# Patient Record
Sex: Female | Born: 1935
Health system: Southern US, Community
[De-identification: ages and names within clinical notes are randomized; demographics above are authoritative.]

## PROBLEM LIST (undated history)

## (undated) DIAGNOSIS — I4819 Other persistent atrial fibrillation: Secondary | ICD-10-CM

## (undated) DIAGNOSIS — G8929 Other chronic pain: Secondary | ICD-10-CM

## (undated) DIAGNOSIS — M25562 Pain in left knee: Secondary | ICD-10-CM

## (undated) DIAGNOSIS — N189 Chronic kidney disease, unspecified: Secondary | ICD-10-CM

## (undated) DIAGNOSIS — I10 Essential (primary) hypertension: Secondary | ICD-10-CM

## (undated) DIAGNOSIS — M79662 Pain in left lower leg: Secondary | ICD-10-CM

## (undated) DIAGNOSIS — M25561 Pain in right knee: Secondary | ICD-10-CM

## (undated) DIAGNOSIS — S8991XA Unspecified injury of right lower leg, initial encounter: Secondary | ICD-10-CM

## (undated) DIAGNOSIS — M1990 Unspecified osteoarthritis, unspecified site: Secondary | ICD-10-CM

## (undated) DIAGNOSIS — K219 Gastro-esophageal reflux disease without esophagitis: Secondary | ICD-10-CM

## (undated) DIAGNOSIS — R001 Bradycardia, unspecified: Secondary | ICD-10-CM

## (undated) DIAGNOSIS — G629 Polyneuropathy, unspecified: Secondary | ICD-10-CM

## (undated) DIAGNOSIS — R351 Nocturia: Secondary | ICD-10-CM

## (undated) DIAGNOSIS — K259 Gastric ulcer, unspecified as acute or chronic, without hemorrhage or perforation: Secondary | ICD-10-CM

## (undated) DIAGNOSIS — I509 Heart failure, unspecified: Secondary | ICD-10-CM

## (undated) DIAGNOSIS — B351 Tinea unguium: Secondary | ICD-10-CM

## (undated) DIAGNOSIS — M79661 Pain in right lower leg: Secondary | ICD-10-CM

## (undated) DIAGNOSIS — Z7901 Long term (current) use of anticoagulants: Secondary | ICD-10-CM

## (undated) HISTORY — DX: Pain in right knee: M25.561

## (undated) HISTORY — PX: CATARACT EXTRACTION: SUR2

## (undated) HISTORY — DX: Bradycardia, unspecified: R00.1

## (undated) HISTORY — DX: Other persistent atrial fibrillation: I48.19

## (undated) HISTORY — DX: Polyneuropathy, unspecified: G62.9

## (undated) HISTORY — DX: Pain in left knee: M25.562

## (undated) HISTORY — DX: Heart failure, unspecified: I50.9

## (undated) HISTORY — PX: OVARIAN CYST REMOVAL: SHX89

## (undated) HISTORY — DX: Other chronic pain: G89.29

## (undated) HISTORY — DX: Pain in right lower leg: M79.661

## (undated) HISTORY — DX: Tinea unguium: B35.1

## (undated) HISTORY — DX: Unspecified injury of right lower leg, initial encounter: S89.91XA

## (undated) HISTORY — DX: Pain in left lower leg: M79.662

## (undated) HISTORY — DX: Long term (current) use of anticoagulants: Z79.01

---

## 2004-03-26 ENCOUNTER — Emergency Department (HOSPITAL_COMMUNITY): Admission: EM | Admit: 2004-03-26 | Discharge: 2004-03-27 | Payer: Self-pay | Admitting: Emergency Medicine

## 2004-03-27 ENCOUNTER — Ambulatory Visit (HOSPITAL_COMMUNITY): Admission: RE | Admit: 2004-03-27 | Discharge: 2004-03-27 | Payer: Self-pay | Admitting: Emergency Medicine

## 2004-05-01 ENCOUNTER — Ambulatory Visit (HOSPITAL_COMMUNITY): Admission: RE | Admit: 2004-05-01 | Discharge: 2004-05-01 | Payer: Self-pay | Admitting: Orthopedic Surgery

## 2007-01-06 ENCOUNTER — Ambulatory Visit (HOSPITAL_BASED_OUTPATIENT_CLINIC_OR_DEPARTMENT_OTHER): Admission: RE | Admit: 2007-01-06 | Discharge: 2007-01-06 | Payer: Self-pay | Admitting: Orthopedic Surgery

## 2011-11-21 ENCOUNTER — Encounter (HOSPITAL_COMMUNITY): Payer: Self-pay | Admitting: Pharmacy Technician

## 2011-11-24 ENCOUNTER — Other Ambulatory Visit: Payer: Self-pay | Admitting: Orthopedic Surgery

## 2011-12-01 ENCOUNTER — Encounter (HOSPITAL_COMMUNITY)
Admission: RE | Admit: 2011-12-01 | Discharge: 2011-12-01 | Disposition: A | Discharge status: Home / Self Care | Payer: Medicare Other | Source: Ambulatory Visit | Attending: Orthopedic Surgery | Admitting: Orthopedic Surgery

## 2011-12-01 ENCOUNTER — Encounter (HOSPITAL_COMMUNITY): Payer: Self-pay

## 2011-12-01 HISTORY — DX: Nocturia: R35.1

## 2011-12-01 HISTORY — DX: Gastric ulcer, unspecified as acute or chronic, without hemorrhage or perforation: K25.9

## 2011-12-01 HISTORY — DX: Unspecified osteoarthritis, unspecified site: M19.90

## 2011-12-01 HISTORY — DX: Essential (primary) hypertension: I10

## 2011-12-01 HISTORY — DX: Gastro-esophageal reflux disease without esophagitis: K21.9

## 2011-12-01 LAB — COMPREHENSIVE METABOLIC PANEL
ALT: 13 U/L (ref 0–35)
AST: 17 U/L (ref 0–37)
Albumin: 3.9 g/dL (ref 3.5–5.2)
Alkaline Phosphatase: 122 U/L — ABNORMAL HIGH (ref 39–117)
Calcium: 9.9 mg/dL (ref 8.4–10.5)
GFR calc Af Amer: 43 mL/min — ABNORMAL LOW (ref >90)
Glucose, Bld: 96 mg/dL (ref 70–99)
Potassium: 4.6 mEq/L (ref 3.5–5.1)
Sodium: 143 mEq/L (ref 135–145)
Total Protein: 7.6 g/dL (ref 6.0–8.3)

## 2011-12-01 LAB — CBC
MCH: 30.1 pg (ref 26.0–34.0)
MCHC: 33.6 g/dL (ref 30.0–36.0)
Platelets: 221 10*3/uL (ref 150–400)
RDW: 12.2 % (ref 11.5–15.5)

## 2011-12-01 LAB — DIFFERENTIAL
Eosinophils Relative: 2 % (ref 0–5)
Lymphocytes Relative: 34 % (ref 12–46)
Lymphs Abs: 2.3 10*3/uL (ref 0.7–4.0)
Monocytes Relative: 6 % (ref 3–12)

## 2011-12-01 LAB — URINE MICROSCOPIC-ADD ON

## 2011-12-01 LAB — URINALYSIS, ROUTINE W REFLEX MICROSCOPIC
Hgb urine dipstick: NEGATIVE
Nitrite: POSITIVE — AB
Specific Gravity, Urine: 1.017 (ref 1.005–1.030)
Urobilinogen, UA: 0.2 mg/dL (ref 0.0–1.0)
pH: 5 (ref 5.0–8.0)

## 2011-12-01 LAB — APTT: aPTT: 37 seconds (ref 24–37)

## 2011-12-01 LAB — PROTIME-INR: Prothrombin Time: 13.1 seconds (ref 11.6–15.2)

## 2011-12-01 LAB — SURGICAL PCR SCREEN: Staphylococcus aureus: NEGATIVE

## 2011-12-01 NOTE — Pre-Procedure Instructions (Signed)
[place]  12/01/2011   Your procedure is scheduled on: 12-08-2011 @7 :30 AM  Report to [place] at 5:30AM.  Call this number if you have problems the morning of surgery: [phone]   Remember:   Do not eat food:After Midnight.  May have clear liquids: up to 4 Hours before arrival.  Clear liquids include soda, tea, black coffee, apple or grape juice, broth. Until 1:30 AM  Take these medicines the morning of surgery with A SIP OF WATER: Prilosec   Do not wear jewelry, make-up or nail polish.  Do not wear lotions, powders, or perfumes. You may wear deodorant.  Do not shave 48 hours prior to surgery.  Do not bring valuables to the hospital.  Contacts, dentures or bridgework may not be worn into surgery.  Leave suitcase in the car. After surgery it may be brought to your room.  For patients admitted to the hospital, checkout time is 11:00 AM the day of discharge.   Patients discharged the day of surgery will not be allowed to drive home.  Name and phone number of your driver  Special Instructions: Incentive Spirometry - Practice and bring it with you on the day of surgery., CHG Shower Use Special Wash: 1/2 bottle night before surgery and 1/2 bottle morning of surgery. and N/A   Please read over the following fact sheets that you were given: Pain Booklet, MRSA Information and Surgical Site Infection Prevention

## 2011-12-01 NOTE — Pre-Procedure Instructions (Signed)
[place]  12/01/2011   Your procedure is scheduled on:12-08-2011 @7 :30 AM Report to [place] at 5:30 AM.  Call this number if you have problems the morning of surgery: [phone]   Remember:   Do not eat food:After Midnight.  May have clear liquids: up to 4 Hours before arrival.  Clear liquids include soda, tea, black coffee, apple or grape juice, broth.  Until 1:30 AM   Take these medicines the morning of surgery with A SIP OF WATER:  Prilosec   Do not wear jewelry, make-up or nail polish.  Do not wear lotions, powders, or perfumes. You may wear deodorant.  Do not shave 48 hours prior to surgery.  Do not bring valuables to the hospital.  Contacts, dentures or bridgework may not be worn into surgery.  Leave suitcase in the car. After surgery it may be brought to your room.  For patients admitted to the hospital, checkout time is 11:00 AM the day of discharge.   Patients discharged the day of surgery will not be allowed to drive home.  Name and phone number of your driver:  Special Instructions: Incentive Spirometry - Practice and bring it with you on the day of surgery. and CHG Shower Use Special Wash: 1/2 bottle night before surgery and 1/2 bottle morning of surgery.   Please read over the following fact sheets that you were given: MRSA Information and Surgical Site Infection Prevention

## 2011-12-01 NOTE — Progress Notes (Signed)
No Stress test available.  Cardiac cath report received from [facility].

## 2011-12-01 NOTE — Progress Notes (Signed)
EKG and CXR requested from Dr. Charisse Klinefelter [phone] or [phone].  Stress test requested from [facility], will bring paper DOS that tell what she will take,will need to sign blood refusal form DOS. Dr. Ronnie Derby aware that pt. Refuses blood.

## 2011-12-02 NOTE — Progress Notes (Signed)
cxr not available dr Huey Bienenstock.need dos

## 2011-12-03 LAB — URINE CULTURE: Culture  Setup Time: 201212171135

## 2011-12-07 MED ORDER — VANCOMYCIN HCL IN DEXTROSE 1-5 GM/200ML-% IV SOLN
1000.0000 mg | INTRAVENOUS | Status: DC
  Filled 2011-12-07: qty 200

## 2011-12-07 MED ORDER — SODIUM CHLORIDE 0.9 % IV SOLN: INTRAVENOUS | Status: DC

## 2011-12-07 MED ORDER — CHLORHEXIDINE GLUCONATE 4 % EX LIQD: 60.0000 mL | Freq: Once | CUTANEOUS | Status: DC

## 2011-12-07 MED ORDER — ACETAMINOPHEN 10 MG/ML IV SOLN
1000.0000 mg | Freq: Four times a day (QID) | INTRAVENOUS | Status: DC
  Filled 2011-12-07 (×4): qty 100

## 2011-12-08 ENCOUNTER — Encounter (HOSPITAL_COMMUNITY): Payer: Self-pay | Admitting: Anesthesiology

## 2011-12-08 ENCOUNTER — Inpatient Hospital Stay (HOSPITAL_COMMUNITY)
Admission: RE | Admit: 2011-12-08 | Discharge: 2011-12-11 | DRG: 470 | Disposition: A | Discharge status: Skilled Nursing Facility | Payer: Medicare Other | Source: Ambulatory Visit | Attending: Orthopedic Surgery | Admitting: Orthopedic Surgery

## 2011-12-08 ENCOUNTER — Encounter (HOSPITAL_COMMUNITY)
Admission: RE | Disposition: A | Discharge status: Skilled Nursing Facility | Payer: Self-pay | Source: Ambulatory Visit | Attending: Orthopedic Surgery

## 2011-12-08 ENCOUNTER — Inpatient Hospital Stay (HOSPITAL_COMMUNITY): Payer: Medicare Other

## 2011-12-08 ENCOUNTER — Inpatient Hospital Stay (HOSPITAL_COMMUNITY): Payer: Medicare Other | Admitting: Anesthesiology

## 2011-12-08 DIAGNOSIS — Z886 Allergy status to analgesic agent status: Secondary | ICD-10-CM

## 2011-12-08 DIAGNOSIS — I10 Essential (primary) hypertension: Secondary | ICD-10-CM | POA: Diagnosis present

## 2011-12-08 DIAGNOSIS — M1710 Unilateral primary osteoarthritis, unspecified knee: Principal | ICD-10-CM | POA: Diagnosis present

## 2011-12-08 DIAGNOSIS — Z880 Allergy status to penicillin: Secondary | ICD-10-CM

## 2011-12-08 DIAGNOSIS — D62 Acute posthemorrhagic anemia: Secondary | ICD-10-CM | POA: Diagnosis not present

## 2011-12-08 DIAGNOSIS — M1712 Unilateral primary osteoarthritis, left knee: Secondary | ICD-10-CM

## 2011-12-08 DIAGNOSIS — Z888 Allergy status to other drugs, medicaments and biological substances status: Secondary | ICD-10-CM

## 2011-12-08 HISTORY — PX: TOTAL KNEE ARTHROPLASTY: SHX125

## 2011-12-08 SURGERY — ARTHROPLASTY, KNEE, TOTAL
Anesthesia: Regional | Site: Knee | Laterality: Left | Wound class: Clean

## 2011-12-08 MED ORDER — HYDROMORPHONE HCL PF 1 MG/ML IJ SOLN
0.2500 mg | INTRAMUSCULAR | Status: DC | PRN
  Administered 2011-12-08 (×2): 0.5 mg via INTRAVENOUS

## 2011-12-08 MED ORDER — PROPOFOL 10 MG/ML IV EMUL
INTRAVENOUS | Status: DC | PRN
  Administered 2011-12-08: 180 mg via INTRAVENOUS

## 2011-12-08 MED ORDER — BUPIVACAINE 0.25 % ON-Q PUMP SINGLE CATH 300ML
INJECTION | Status: DC | PRN
  Administered 2011-12-08: 300 mL

## 2011-12-08 MED ORDER — LIDOCAINE HCL (CARDIAC) 20 MG/ML IV SOLN
INTRAVENOUS | Status: DC | PRN
  Administered 2011-12-08: 80 mg via INTRAVENOUS

## 2011-12-08 MED ORDER — VANCOMYCIN HCL IN DEXTROSE 1-5 GM/200ML-% IV SOLN
1000.0000 mg | Freq: Two times a day (BID) | INTRAVENOUS | Status: AC
  Administered 2011-12-08: 1000 mg via INTRAVENOUS
  Filled 2011-12-08: qty 200

## 2011-12-08 MED ORDER — ONDANSETRON HCL 4 MG/2ML IJ SOLN: 4.0000 mg | Freq: Four times a day (QID) | INTRAMUSCULAR | Status: DC | PRN

## 2011-12-08 MED ORDER — METOCLOPRAMIDE HCL 5 MG/ML IJ SOLN
5.0000 mg | Freq: Three times a day (TID) | INTRAMUSCULAR | Status: DC | PRN
  Filled 2011-12-08: qty 2

## 2011-12-08 MED ORDER — OLMESARTAN MEDOXOMIL 40 MG PO TABS
40.0000 mg | ORAL_TABLET | Freq: Every day | ORAL | Status: DC
  Filled 2011-12-08: qty 1

## 2011-12-08 MED ORDER — DIPHENHYDRAMINE HCL 12.5 MG/5ML PO ELIX
12.5000 mg | ORAL_SOLUTION | ORAL | Status: DC | PRN
  Filled 2011-12-08: qty 10

## 2011-12-08 MED ORDER — BUPIVACAINE 0.25 % ON-Q PUMP SINGLE CATH 300ML
300.0000 mL | INJECTION | Status: DC
  Filled 2011-12-08: qty 300

## 2011-12-08 MED ORDER — AMLODIPINE BESYLATE 5 MG PO TABS
5.0000 mg | ORAL_TABLET | Freq: Every day | ORAL | Status: DC
  Filled 2011-12-08: qty 1

## 2011-12-08 MED ORDER — BISACODYL 5 MG PO TBEC: 5.0000 mg | DELAYED_RELEASE_TABLET | Freq: Every day | ORAL | Status: DC | PRN

## 2011-12-08 MED ORDER — HYDROMORPHONE HCL PF 1 MG/ML IJ SOLN
0.5000 mg | INTRAMUSCULAR | Status: DC | PRN
  Administered 2011-12-08: 1 mg via INTRAVENOUS
  Administered 2011-12-08 – 2011-12-09 (×3): 0.5 mg via INTRAVENOUS
  Filled 2011-12-08 (×3): qty 1

## 2011-12-08 MED ORDER — ONDANSETRON HCL 4 MG PO TABS: 4.0000 mg | ORAL_TABLET | Freq: Four times a day (QID) | ORAL | Status: DC | PRN

## 2011-12-08 MED ORDER — HYDROCHLOROTHIAZIDE 50 MG PO TABS
50.0000 mg | ORAL_TABLET | Freq: Every day | ORAL | Status: DC
  Administered 2011-12-08: 50 mg via ORAL
  Filled 2011-12-08 (×4): qty 1

## 2011-12-08 MED ORDER — ONDANSETRON HCL 4 MG/2ML IJ SOLN
INTRAMUSCULAR | Status: DC | PRN
  Administered 2011-12-08: 4 mg via INTRAVENOUS

## 2011-12-08 MED ORDER — METOCLOPRAMIDE HCL 10 MG PO TABS: 5.0000 mg | ORAL_TABLET | Freq: Three times a day (TID) | ORAL | Status: DC | PRN

## 2011-12-08 MED ORDER — METHOCARBAMOL 100 MG/ML IJ SOLN
500.0000 mg | INTRAVENOUS | Status: AC
  Administered 2011-12-08: 500 mg via INTRAVENOUS
  Filled 2011-12-08: qty 5

## 2011-12-08 MED ORDER — PANTOPRAZOLE SODIUM 40 MG PO TBEC
40.0000 mg | DELAYED_RELEASE_TABLET | Freq: Every day | ORAL | Status: DC
  Administered 2011-12-09 – 2011-12-11 (×3): 40 mg via ORAL
  Filled 2011-12-08 (×4): qty 1

## 2011-12-08 MED ORDER — OXYCODONE HCL 10 MG PO TB12
10.0000 mg | ORAL_TABLET | Freq: Two times a day (BID) | ORAL | Status: DC
  Administered 2011-12-08 – 2011-12-11 (×7): 10 mg via ORAL
  Filled 2011-12-08 (×7): qty 1

## 2011-12-08 MED ORDER — SODIUM CHLORIDE 0.9 % IR SOLN
Status: DC | PRN
  Administered 2011-12-08: 3000 mL

## 2011-12-08 MED ORDER — ACETAMINOPHEN 325 MG PO TABS
650.0000 mg | ORAL_TABLET | Freq: Four times a day (QID) | ORAL | Status: DC | PRN
  Administered 2011-12-09 – 2011-12-11 (×3): 650 mg via ORAL
  Filled 2011-12-08 (×3): qty 2

## 2011-12-08 MED ORDER — BUPIVACAINE ON-Q PAIN PUMP (FOR ORDER SET NO CHG)
INJECTION | Status: AC
  Filled 2011-12-08: qty 1

## 2011-12-08 MED ORDER — METHOCARBAMOL 500 MG PO TABS
500.0000 mg | ORAL_TABLET | Freq: Four times a day (QID) | ORAL | Status: DC | PRN
  Administered 2011-12-08 – 2011-12-10 (×3): 500 mg via ORAL
  Filled 2011-12-08 (×4): qty 1

## 2011-12-08 MED ORDER — AMLODIPINE-OLMESARTAN 10-40 MG PO TABS: 1.0000 | ORAL_TABLET | Freq: Every day | ORAL | Status: DC

## 2011-12-08 MED ORDER — OXYCODONE HCL 5 MG PO TABS
5.0000 mg | ORAL_TABLET | ORAL | Status: DC | PRN
  Administered 2011-12-08: 10 mg via ORAL
  Administered 2011-12-09 – 2011-12-10 (×5): 5 mg via ORAL
  Administered 2011-12-11 (×3): 10 mg via ORAL
  Filled 2011-12-08 (×4): qty 1
  Filled 2011-12-08 (×4): qty 2
  Filled 2011-12-08 (×2): qty 1

## 2011-12-08 MED ORDER — ENOXAPARIN SODIUM 40 MG/0.4ML ~~LOC~~ SOLN
40.0000 mg | SUBCUTANEOUS | Status: DC
  Administered 2011-12-09 – 2011-12-11 (×3): 40 mg via SUBCUTANEOUS
  Filled 2011-12-08 (×4): qty 0.4

## 2011-12-08 MED ORDER — ZOLPIDEM TARTRATE 5 MG PO TABS: 5.0000 mg | ORAL_TABLET | Freq: Every evening | ORAL | Status: DC | PRN

## 2011-12-08 MED ORDER — METHOCARBAMOL 100 MG/ML IJ SOLN
500.0000 mg | Freq: Four times a day (QID) | INTRAVENOUS | Status: DC | PRN
  Filled 2011-12-08: qty 5

## 2011-12-08 MED ORDER — LACTATED RINGERS IV SOLN
INTRAVENOUS | Status: DC | PRN
  Administered 2011-12-08 (×2): via INTRAVENOUS

## 2011-12-08 MED ORDER — ONDANSETRON HCL 4 MG/2ML IJ SOLN: 4.0000 mg | Freq: Once | INTRAMUSCULAR | Status: DC | PRN

## 2011-12-08 MED ORDER — BUPIVACAINE-EPINEPHRINE 0.25% -1:200000 IJ SOLN
INTRAMUSCULAR | Status: DC | PRN
  Administered 2011-12-08: 20 mL

## 2011-12-08 MED ORDER — AMLODIPINE BESYLATE 10 MG PO TABS
10.0000 mg | ORAL_TABLET | Freq: Every day | ORAL | Status: DC
  Administered 2011-12-08: 10 mg via ORAL
  Filled 2011-12-08 (×4): qty 1

## 2011-12-08 MED ORDER — ACETAMINOPHEN 650 MG RE SUPP: 650.0000 mg | Freq: Four times a day (QID) | RECTAL | Status: DC | PRN

## 2011-12-08 MED ORDER — SODIUM CHLORIDE 0.9 % IV SOLN: INTRAVENOUS | Status: DC

## 2011-12-08 MED ORDER — ALUM & MAG HYDROXIDE-SIMETH 200-200-20 MG/5ML PO SUSP: 30.0000 mL | ORAL | Status: DC | PRN

## 2011-12-08 MED ORDER — EPHEDRINE SULFATE 50 MG/ML IJ SOLN
INTRAMUSCULAR | Status: DC | PRN
  Administered 2011-12-08 (×2): 10 mg via INTRAVENOUS

## 2011-12-08 MED ORDER — LISINOPRIL 20 MG PO TABS
20.0000 mg | ORAL_TABLET | Freq: Every day | ORAL | Status: DC
  Administered 2011-12-08: 20 mg via ORAL
  Filled 2011-12-08 (×4): qty 1

## 2011-12-08 MED ORDER — BUPIVACAINE-EPINEPHRINE PF 0.5-1:200000 % IJ SOLN
INTRAMUSCULAR | Status: DC | PRN
  Administered 2011-12-08: 20 mL

## 2011-12-08 MED ORDER — SENNOSIDES-DOCUSATE SODIUM 8.6-50 MG PO TABS: 1.0000 | ORAL_TABLET | Freq: Every evening | ORAL | Status: DC | PRN

## 2011-12-08 MED ORDER — FENTANYL CITRATE 0.05 MG/ML IJ SOLN
INTRAMUSCULAR | Status: DC | PRN
  Administered 2011-12-08 (×5): 50 ug via INTRAVENOUS

## 2011-12-08 MED ORDER — PHENOL 1.4 % MT LIQD
1.0000 | OROMUCOSAL | Status: DC | PRN
  Filled 2011-12-08: qty 177

## 2011-12-08 MED ORDER — CHLORTHALIDONE 25 MG PO TABS
25.0000 mg | ORAL_TABLET | Freq: Every day | ORAL | Status: DC
  Administered 2011-12-08: 25 mg via ORAL
  Filled 2011-12-08 (×4): qty 1

## 2011-12-08 MED ORDER — MENTHOL 3 MG MT LOZG: 1.0000 | LOZENGE | OROMUCOSAL | Status: DC | PRN

## 2011-12-08 MED ORDER — VANCOMYCIN HCL 1000 MG IV SOLR
1000.0000 mg | INTRAVENOUS | Status: DC | PRN
  Administered 2011-12-08: 1000 mg via INTRAVENOUS

## 2011-12-08 MED ORDER — DOCUSATE SODIUM 100 MG PO CAPS
100.0000 mg | ORAL_CAPSULE | Freq: Two times a day (BID) | ORAL | Status: DC
  Administered 2011-12-08 – 2011-12-11 (×7): 100 mg via ORAL
  Filled 2011-12-08 (×8): qty 1

## 2011-12-08 MED ORDER — OLMESARTAN MEDOXOMIL 40 MG PO TABS
40.0000 mg | ORAL_TABLET | Freq: Every day | ORAL | Status: DC
  Administered 2011-12-08: 40 mg via ORAL
  Filled 2011-12-08 (×4): qty 1

## 2011-12-08 SURGICAL SUPPLY — 59 items
BANDAGE ESMARK 6X9 LF (GAUZE/BANDAGES/DRESSINGS) ×1 IMPLANT
BLADE SAGITTAL 13X1.27X60 (BLADE) ×2 IMPLANT
BLADE SAW SGTL 83.5X18.5 (BLADE) ×2 IMPLANT
BNDG ESMARK 6X9 LF (GAUZE/BANDAGES/DRESSINGS) ×2
BOWL SMART MIX CTS (DISPOSABLE) ×2 IMPLANT
CATH KIT ON Q 10IN SLV (PAIN MANAGEMENT) ×2 IMPLANT
CEMENT BONE SIMPLEX SPEEDSET (Cement) ×4 IMPLANT
CLOTH BEACON ORANGE TIMEOUT ST (SAFETY) ×2 IMPLANT
COVER BACK TABLE 24X17X13 BIG (DRAPES) IMPLANT
COVER SURGICAL LIGHT HANDLE (MISCELLANEOUS) ×2 IMPLANT
CUFF TOURNIQUET SINGLE 34IN LL (TOURNIQUET CUFF) ×2 IMPLANT
DRAPE EXTREMITY T 121X128X90 (DRAPE) ×2 IMPLANT
DRAPE INCISE IOBAN 66X45 STRL (DRAPES) ×4 IMPLANT
DRAPE PROXIMA HALF (DRAPES) ×2 IMPLANT
DRAPE U-SHAPE 47X51 STRL (DRAPES) ×2 IMPLANT
DRSG ADAPTIC 3X8 NADH LF (GAUZE/BANDAGES/DRESSINGS) ×2 IMPLANT
DRSG PAD ABDOMINAL 8X10 ST (GAUZE/BANDAGES/DRESSINGS) ×2 IMPLANT
DURAPREP 26ML APPLICATOR (WOUND CARE) ×4 IMPLANT
ELECT REM PT RETURN 9FT ADLT (ELECTROSURGICAL) ×2
ELECTRODE REM PT RTRN 9FT ADLT (ELECTROSURGICAL) ×1 IMPLANT
EVACUATOR 1/8 PVC DRAIN (DRAIN) ×2 IMPLANT
GAUZE SPONGE 4X4 12PLY STRL LF (GAUZE/BANDAGES/DRESSINGS) ×2 IMPLANT
GLOVE BIOGEL M 7.0 STRL (GLOVE) IMPLANT
GLOVE BIOGEL PI IND STRL 7.0 (GLOVE) ×2 IMPLANT
GLOVE BIOGEL PI IND STRL 7.5 (GLOVE) ×2 IMPLANT
GLOVE BIOGEL PI IND STRL 8.5 (GLOVE) ×2 IMPLANT
GLOVE BIOGEL PI INDICATOR 7.0 (GLOVE) ×2
GLOVE BIOGEL PI INDICATOR 7.5 (GLOVE) ×2
GLOVE BIOGEL PI INDICATOR 8.5 (GLOVE) ×2
GLOVE SURG ORTHO 8.0 STRL STRW (GLOVE) ×4 IMPLANT
GLOVE SURG SS PI 7.5 STRL IVOR (GLOVE) ×2 IMPLANT
GOWN PREVENTION PLUS XLARGE (GOWN DISPOSABLE) ×4 IMPLANT
GOWN STRL NON-REIN LRG LVL3 (GOWN DISPOSABLE) ×4 IMPLANT
HANDPIECE INTERPULSE COAX TIP (DISPOSABLE) ×1
HOOD PEEL AWAY FACE SHEILD DIS (HOOD) ×8 IMPLANT
KIT BASIN OR (CUSTOM PROCEDURE TRAY) ×2 IMPLANT
KIT ROOM TURNOVER OR (KITS) ×2 IMPLANT
MANIFOLD NEPTUNE II (INSTRUMENTS) ×2 IMPLANT
NEEDLE 22X1 1/2 (OR ONLY) (NEEDLE) IMPLANT
NS IRRIG 1000ML POUR BTL (IV SOLUTION) ×2 IMPLANT
PACK TOTAL JOINT (CUSTOM PROCEDURE TRAY) ×2 IMPLANT
PAD ARMBOARD 7.5X6 YLW CONV (MISCELLANEOUS) ×4 IMPLANT
PADDING CAST COTTON 6X4 STRL (CAST SUPPLIES) ×2 IMPLANT
POSITIONER HEAD PRONE TRACH (MISCELLANEOUS) ×2 IMPLANT
SET HNDPC FAN SPRY TIP SCT (DISPOSABLE) ×1 IMPLANT
SPONGE GAUZE 4X4 12PLY (GAUZE/BANDAGES/DRESSINGS) ×2 IMPLANT
STAPLER VISISTAT 35W (STAPLE) ×2 IMPLANT
SUCTION FRAZIER TIP 10 FR DISP (SUCTIONS) ×2 IMPLANT
SUT BONE WAX W31G (SUTURE) ×2 IMPLANT
SUT VIC AB 0 CTB1 27 (SUTURE) ×4 IMPLANT
SUT VIC AB 1 CT1 27 (SUTURE) ×4
SUT VIC AB 1 CT1 27XBRD ANBCTR (SUTURE) ×4 IMPLANT
SUT VIC AB 2-0 CT1 27 (SUTURE) ×2
SUT VIC AB 2-0 CT1 TAPERPNT 27 (SUTURE) ×2 IMPLANT
SYR CONTROL 10ML LL (SYRINGE) IMPLANT
TOWEL OR 17X24 6PK STRL BLUE (TOWEL DISPOSABLE) ×2 IMPLANT
TOWEL OR 17X26 10 PK STRL BLUE (TOWEL DISPOSABLE) ×2 IMPLANT
TRAY FOLEY CATH 14FR (SET/KITS/TRAYS/PACK) ×2 IMPLANT
WATER STERILE IRR 1000ML POUR (IV SOLUTION) ×6 IMPLANT

## 2011-12-08 NOTE — Transfer of Care (Signed)
Immediate Anesthesia Transfer of Care Note  Patient: Tracey Allen  Procedure(s) Performed:  TOTAL KNEE ARTHROPLASTY - Total Knee Arthroplasty Left   Patient Location: PACU  Anesthesia Type: General and Regional  Level of Consciousness: awake, oriented and patient cooperative  Airway & Oxygen Therapy: Patient Spontanous Breathing and Patient connected to face mask oxygen  Post-op Assessment: Report given to PACU RN  Post vital signs: Reviewed and stable  Complications: No apparent anesthesia complications

## 2011-12-08 NOTE — Plan of Care (Signed)
Problem: Phase I Progression Outcomes Goal: Initial discharge plan identified Outcome: Progressing Wants to be d/c'd to [facility] [place]

## 2011-12-08 NOTE — Interval H&P Note (Signed)
History and Physical Interval Note:  12/08/2011 7:09 AM  Tracey Allen  has presented today for surgery, with the diagnosis of OA Left Knee  The various methods of treatment have been discussed with the patient and family. After consideration of risks, benefits and other options for treatment, the patient has consented to  Procedure(s): TOTAL KNEE ARTHROPLASTY as a surgical intervention .  The patients' history has been reviewed, patient examined, no change in status, stable for surgery.  I have reviewed the patients' chart and labs.  Questions were answered to the patient's satisfaction.     Keiton Cosma,STEPHEN D

## 2011-12-08 NOTE — Preoperative (Signed)
Beta Blockers   Reason not to administer Beta Blockers:Not Applicable 

## 2011-12-08 NOTE — Anesthesia Preprocedure Evaluation (Signed)
Anesthesia Evaluation  Patient identified by MRN, date of birth, ID band Patient awake    Reviewed: Allergy & Precautions, H&P , NPO status , Patient's Chart, lab work & pertinent test results  History of Anesthesia Complications (+) AWARENESS UNDER ANESTHESIA  Airway Mallampati: II TM Distance: >3 FB Neck ROM: full    Dental  (+) Edentulous Lower and Caps   Pulmonary neg pulmonary ROS,    Pulmonary exam normal       Cardiovascular Exercise Tolerance: Good hypertension, regular Normal    Neuro/Psych Negative Neurological ROS  Negative Psych ROS   GI/Hepatic Neg liver ROS, PUD, GERD-  ,  Endo/Other  Negative Endocrine ROS  Renal/GU negative Renal ROS  Genitourinary negative   Musculoskeletal   Abdominal   Peds  Hematology negative hematology ROS (+)   Anesthesia Other Findings   Reproductive/Obstetrics                           Anesthesia Physical Anesthesia Plan  ASA: II  Anesthesia Plan: General   Post-op Pain Management:    Induction: Intravenous  Airway Management Planned: Oral ETT  Additional Equipment:   Intra-op Plan:   Post-operative Plan: Extubation in OR  Informed Consent:   Plan Discussed with: CRNA, Anesthesiologist and Surgeon  Anesthesia Plan Comments:         Anesthesia Quick Evaluation

## 2011-12-08 NOTE — H&P (Signed)
Tracey Allen MRN:  [id number] DOB/SEX:  [birth year]/female  CHIEF COMPLAINT:  Painful left Knee  HISTORY: Patient is a 77 y.o. female presented with a history of pain in the left knee. Onset of symptoms was gradual starting several years ago with gradually worsening course since that time. The patient noted no past surgery on the left knee. Prior procedures on the knee include . Patient has been treated conservatively with over-the-counter NSAIDs and activity modification. Patient currently rates pain in the knee at 8 out of 10 with activity. There is pain at night.  PAST MEDICAL HISTORY: There are no active problems to display for this patient.  Past Medical History  Diagnosis Date  . GERD (gastroesophageal reflux disease)   . Hypertension   . Arthritis   . Urination, excessive at night   . Stomach ulcer    Past Surgical History  Procedure Date  . Ovarian cyst removal   . Cataract extraction     bilateral     MEDICATIONS:   Prescriptions prior to admission  Medication Sig Dispense Refill  . amLODipine-olmesartan (AZOR) 10-40 MG per tablet Take 1 tablet by mouth daily.        . Ascorbic Acid (VITAMIN C PO) Take 1 tablet by mouth daily.        . chlorthalidone (HYGROTON) 25 MG tablet Take 25 mg by mouth daily.        . Cyanocobalamin (B-12 PO) Take 1 tablet by mouth daily.        . hydrochlorothiazide (HYDRODIURIL) 50 MG tablet Take 50 mg by mouth daily.        [name] lisinopril (PRINIVIL,ZESTRIL) 20 MG tablet Take 20 mg by mouth daily.        [name] omeprazole (PRILOSEC) 20 MG capsule Take 20 mg by mouth 2 (two) times daily.          ALLERGIES:   Allergies  Allergen Reactions  . Amoxicillin     blisters  . Codeine     rash  . Norvasc (Amlodipine Besylate)     Unknown reaction; pt currently taking AZOR  . Penicillins Rash    REVIEW OF SYSTEMS:  Pertinent items are noted in HPI.   FAMILY HISTORY:  No family history on file.  SOCIAL HISTORY:   History  Substance Use  Topics  . Smoking status: Former Smoker    Types: Cigarettes  . Smokeless tobacco: Not on file  . Alcohol Use: No     EXAMINATION:  Vital signs in last 24 hours: Temp:  [97.5 F (36.4 C)] 97.5 F (36.4 C) (12/24 0557) Pulse Rate:  [62] 62  (12/24 0557) Resp:  [18] 18  (12/24 0557) BP: (133)/(78) 133/78 mmHg (12/24 0557) SpO2:  [99 %] 99 % (12/24 0557)  General appearance: alert, cooperative and no distress Head: Normocephalic, without obvious abnormality, atraumatic Lungs: clear to auscultation bilaterally Heart: regular rate and rhythm, S1, S2 normal, no murmur, click, rub or gallop Abdomen: soft, non-tender; bowel sounds normal; no masses,  no organomegaly Pelvic: cervix normal in appearance, external genitalia normal, no adnexal masses or tenderness, no cervical motion tenderness, rectovaginal septum normal, uterus normal size, shape, and consistency and vagina normal without discharge Extremities: extremities normal, atraumatic, no cyanosis or edema and Homans sign is negative, no sign of DVT Pulses: 2+ and symmetric Skin: Skin color, texture, turgor normal. No rashes or lesions Neurologic: Alert and oriented X 3, normal strength and tone. Normal symmetric reflexes. Normal coordination and gait  Musculoskeletal:  ROM 0-105 , Ligaments intact,  Imaging Review Plain radiographs demonstrate severe degenerative joint disease of the left knee. The overall alignment is mild varus. The bone quality appears to be fair for age and reported activity level.  Assessment/Plan: End stage arthritis, left knee   The patient history, physical examination and imaging studies are consistent with advanced degenerative joint disease of the left knee. The patient has failed conservative treatment.  The clearance notes were reviewed.  After discussion with the patient it was felt that Total Knee Replacement was indicated. The procedure,  risks, and benefits of total knee arthroplasty were presented  and reviewed. The risks including but not limited to aseptic loosening, infection, blood clots, vascular injury, stiffness, patella tracking problems complications among others were discussed. The patient acknowledged the explanation, agreed to proceed with the plan.  Tracey Allen 12/08/2011, 6:53 AM

## 2011-12-08 NOTE — Op Note (Signed)
TOTAL KNEE REPLACEMENT OPERATIVE NOTE:  12/08/2011  1:38 PM  PATIENT:  Tracey Allen  77 y.o. female  PRE-OPERATIVE DIAGNOSIS:  OA Left Knee  POST-OPERATIVE DIAGNOSIS:  OA Left Knee  PROCEDURE:  Procedure(s): TOTAL KNEE ARTHROPLASTY  SURGEON:  Surgeon(s): Rudean Haskell, MD  PHYSICIAN ASSISTANT: Carlynn Spry, [facility]  ANESTHESIA:   General  DRAINS: Hemovac and On-Q Marcaine Pain Pump  SPECIMEN: None  COUNTS:  Correct  TOURNIQUET:   Total Tourniquet Time Documented: Thigh (Left) - 61 minutes  DICTATION:  Indication for procedure:    The patient is a 77 y.o. female who has failed conservative treatment for OA Left Knee.  Informed consent was obtained prior to anesthesia. The risks versus benefits of the operation were explain and in a way the patient can, and did, understand.   Description of procedure:     The patient was taken to the operating room and placed under anesthesia.  The patient was positioned in the usual fashion taking care that all body parts were adequately padded and/or protected.  I foley catheter was placed.  A tourniquet was applied and the leg prepped and draped in the usual sterile fashion.  The extremity was exsanguinated with the esmarch and tourniquet inflated to 350 mmHg.  Pre-operative range of motion was normal.  The knee was in 7 degree of mild valgus.  A midline incision approximately 6-7 inches long was made with a #10 blade.  A new blade was used to make a parapatellar arthrotomy going 2-3 cm into the quadriceps tendon, over the patella, and alongside the medial aspect of the patellar tendon.  A synovectomy was then performed with the #10 blade and forceps. I then elevated the deep MCL off the medial tibial metaphysis subperiosteally around to the semimembranosus attachment.    I everted the patella and used calipers to measure patellar thickness, which was ###.  I used the reamer to ream down to appropriate thickness to recreated the native  thickness.  I then removed excess bone with the rongeur and sagittal saw.  I used the ## mm template and drilled the three lug holes.  I then put the trial in place and measured the thickness with the calipers to ensure recreation of the native thickness.  The trial was then removed and the patella subluxed and the knee brought into flexion.  A homan retractor was place to retract and protect the patella and lateral structures.  A Z-retractor was place medially to protect the medial structures.  The extra-medullary alignment system was used to make cut the tibial articular surface perpendicular to the anamotic axis of the tibia and in 3 degrees of posterior slope.  The cut surface and alignment jig was removed.  I then used the intramedullary alignment guide to make a 4 valgus cut on the distal femur.  I then marked out the epicondylar axis on the distal femur.  The posterior condylar axis measured 3 degrees.  I then used the anterior referencing sizer and measured the femur to be a size E.  The 4-In-1 cutting block was screwed into place in external rotation matching the posterior condylar angle, making our cuts perpendicular to the epicondylar axis.  Anterior, posterior and chamfer cuts were made with the sagittal saw.  The cutting block and cut pieces were removed.  A lamina spreader was placed in 90 degrees of flexion.  The ACL, PCL, menisci, and posterior condylar osteophytes were removed.  A 10 mm spacer blocked was found to  offer good flexion and extension gap balance after mild in degree releasing.   The scoop retractor was then placed and the femoral finishing block was pinned in place.  The small sagittal saw was used as well as the lug drill to finish the femur.  The block and cut surfaces were removed and the medullary canal hole filled with autograft bone from the cut pieces.  The tibia was delivered forward in deep flexion and external rotation.  A size 4 tray was selected and pinned into place  centered on the medial 1/3 of the tibial tubercle.  The reamer and keel was used to prepare the tibia through the tray.    I then trialed with the size E femur, size 4 tibia, a 10 mm insert and the 32 patella.  I had excellent flexion/extension gap balance, excellent patella tracking.  Flexion was full and beyond 120 degrees; extension was zero.  These components were chosen and the staff opened them to me on the back table while the knee was lavaged copiously and the cement mixed.  I cemented in the components and removed all excess cement.  The polyethylene tibial component was snapped into place and the knee placed in extension while cement was hardening.  The capsule was infilltrated with 20cc of .25% Marcaine with epinephrine.  A hemovac was place in the joint exiting superolaterally.  A pain pump was place superomedially superficial to the arthrotomy.  Once the cement was hard, the tourniquet was let down.  Hemostasis was obtained.  The arthrotomy was closed with figure-8 #1 vicryl sutures.  The deep soft tissues were closed with #0 vicryls and the subcuticular layer closed with a running #2-0 vicryl.  The skin was reapproximated and closed with skin staples.  The wound was dressed with xeroform, 4 x4's, 2 ABD sponges, a single layer of webril and a TED stocking.   The patient was then awakened, extubated, and taken to the recovery room in stable condition.  BLOOD LOSS:  300cc DRAINS: 1 hemovac, 1 pain catheter COMPLICATIONS:  None.  PLAN OF CARE: Admit to inpatient   PATIENT DISPOSITION:  PACU - hemodynamically stable.   Delay start of Pharmacological VTE agent (>24hrs) due to surgical blood loss or risk of bleeding:  not applicable

## 2011-12-08 NOTE — Anesthesia Procedure Notes (Addendum)
Anesthesia Regional Block:  Femoral nerve block  Pre-Anesthetic Checklist: ,, timeout performed, Correct Patient, Correct Site, Correct Laterality, Correct Procedure, Correct Position, site marked, Risks and benefits discussed,  Surgical consent,  Pre-op evaluation,  At surgeon's request and post-op pain management  Laterality: Left  Prep: Maximum Sterile Barrier Precautions used and chloraprep       Needles:  Injection technique: Single-shot  Needle Type: Stimulator Needle - 80        Needle insertion depth: 7 cm   Additional Needles:  Procedures: nerve stimulator Femoral nerve block  Nerve Stimulator or Paresthesia:  Response: 0.5 mA, 0.1 ms, 7 cm  Additional Responses:   Narrative:  Start time: 12/08/2011 7:00 AM End time: 12/08/2011 7:05 AM Injection made incrementally with aspirations every 5 mL.  Performed by: Personally  Anesthesiologist: Sharolyn Douglas MD  Additional Notes: 20cc 0.5% Marcaine w/o difficulty or discomfort  GES  Femoral nerve block Procedure Name: LMA Insertion Date/Time: 12/08/2011 7:37 AM Performed by: Jon Gills Pre-anesthesia Checklist: Patient identified, Emergency Drugs available, Suction available and Patient being monitored Patient Re-evaluated:Patient Re-evaluated prior to inductionOxygen Delivery Method: Circle System Utilized Preoxygenation: Pre-oxygenation with 100% oxygen Intubation Type: IV induction LMA: LMA with gastric port inserted LMA Size: 4.0 Number of attempts: 1 Placement Confirmation: positive ETCO2 and breath sounds checked- equal and bilateral Tube secured with: Tape Dental Injury: Teeth and Oropharynx as per pre-operative assessment

## 2011-12-08 NOTE — Anesthesia Postprocedure Evaluation (Signed)
  Anesthesia Post-op Note  Patient: Tracey Allen  Procedure(s) Performed:  TOTAL KNEE ARTHROPLASTY - Total Knee Arthroplasty Left   Patient Location: PACU  Anesthesia Type: GA combined with regional for post-op pain  Level of Consciousness: awake, oriented, sedated and patient cooperative  Airway and Oxygen Therapy: Patient Spontanous Breathing and Patient connected to nasal cannula oxygen  Post-op Pain: mild  Post-op Assessment: Post-op Vital signs reviewed, Patient's Cardiovascular Status Stable, Respiratory Function Stable, Patent Airway, No signs of Nausea or vomiting and Pain level controlled  Post-op Vital Signs: stable  Complications: No apparent anesthesia complications

## 2011-12-09 LAB — BASIC METABOLIC PANEL
GFR calc Af Amer: 47 mL/min — ABNORMAL LOW (ref >90)
GFR calc non Af Amer: 40 mL/min — ABNORMAL LOW (ref >90)
Potassium: 4.2 mEq/L (ref 3.5–5.1)
Sodium: 135 mEq/L (ref 135–145)

## 2011-12-09 LAB — CBC
Hemoglobin: 8.8 g/dL — ABNORMAL LOW (ref 12.0–15.0)
MCHC: 33.6 g/dL (ref 30.0–36.0)
RDW: 12.3 % (ref 11.5–15.5)

## 2011-12-09 NOTE — Progress Notes (Signed)
Physical Therapy Evaluation Patient Details Name: Tracey Allen MRN: [id number] DOB: [birth year] Today's Date: 12/09/2011  Problem List: There is no problem list on file for this patient.   Past Medical History:  Past Medical History  Diagnosis Date  . GERD (gastroesophageal reflux disease)   . Hypertension   . Arthritis   . Urination, excessive at night   . Stomach ulcer    Past Surgical History:  Past Surgical History  Procedure Date  . Ovarian cyst removal   . Cataract extraction     bilateral    PT Assessment/Plan/Recommendation PT Assessment Clinical Impression Statement: Patient is an 77 yo female s/p left TKA.  Patient with increased pain, and difficulty with mobility today. Recommend ST-SNF at discharge for continued therapy. PT Recommendation/Assessment: Patient will need skilled PT in the acute care venue PT Problem List: Decreased strength;Decreased range of motion;Decreased activity tolerance;Decreased mobility;Decreased cognition;Decreased knowledge of use of DME;Decreased knowledge of precautions;Pain Barriers to Discharge: Decreased caregiver support PT Therapy Diagnosis : Difficulty walking;Generalized weakness;Acute pain;Altered mental status PT Plan PT Frequency: 7X/week PT Treatment/Interventions: DME instruction;Gait training;Functional mobility training;Therapeutic exercise;Cognitive remediation;Patient/family education PT Recommendation Follow Up Recommendations: Skilled nursing facility Equipment Recommended: Defer to next venue PT Goals  Acute Rehab PT Goals PT Goal Formulation: With patient Time For Goal Achievement: 2 weeks Pt will go Supine/Side to Sit: with min assist;with HOB 0 degrees;with rail PT Goal: Supine/Side to Sit - Progress: Not met Pt will go Sit to Supine/Side: with min assist;with HOB 0 degrees;with rail PT Goal: Sit to Supine/Side - Progress: Not met Pt will go Sit to Stand: with min assist PT Goal: Sit to Stand - Progress:  Not met Pt will go Stand to Sit: with min assist PT Goal: Stand to Sit - Progress: Not met Pt will Transfer Bed to Chair/Chair to Bed: with min assist PT Transfer Goal: Bed to Chair/Chair to Bed - Progress: Not met Pt will Ambulate: 16 - 50 feet;with min assist;with rolling walker PT Goal: Ambulate - Progress: Not met  PT Evaluation Precautions/Restrictions  Precautions Precautions: Knee Required Braces or Orthoses: No Restrictions Weight Bearing Restrictions: Yes LLE Weight Bearing: Weight bearing as tolerated Prior Functioning  Home Living Lives With: Alone Type of Home: House Home Layout: One level Home Access: Level entry Additional Comments: Plan is to go to [place] for therapy at discharge per patient  Prior Function Level of Independence: Independent with basic ADLs;Independent with homemaking with ambulation;Independent with gait Driving: Yes Cognition Cognition Arousal/Alertness: Awake/alert Overall Cognitive Status: Impaired Attention: Impaired Current Attention Level: Sustained Attention - Other Comments: Needed redirection to task Memory: Appears impaired Orientation Level: Oriented to person;Oriented to situation Following Commands: Follows one step commands with increased time Problem Solving: Requires assistance for problem solving Cognition - Other Comments: Patient states she is at Short Stay (incorrect) but was able to state the hospital name of [name].  Patient was unable to tell PT how she would call the nurse for help.  Re-educated patient on call bell. Sensation/Coordination   Extremity Assessment RUE Assessment RUE Assessment: Within Functional Limits LUE Assessment LUE Assessment: Within Functional Limits RLE Assessment RLE Assessment: Exceptions to [facility] (Grossly 4/5 strength) LLE Assessment LLE Assessment: Exceptions to WFL (Unable to move LLE against gravity) Mobility (including Balance) Bed Mobility Bed Mobility: Yes Sit  to Supine - Right: 1: +2 Total assist;HOB flat;Patient percentage (comment) (Patient = 0) Sit to Supine - Right Details (indicate cue type and reason): Patient unable to assist  with transition to supine Transfers Transfers: Yes Sit to Stand: 1: +2 Total assist;Patient percentage (comment);With upper extremity assist;From chair/3-in-1 (Patient = 50%) Sit to Stand Details (indicate cue type and reason): Verbal and tactile cues for hand placement and technique Stand to Sit: 1: +2 Total assist;Patient percentage (comment);With upper extremity assist;To bed (Patient = 60%) Stand to Sit Details: Cues for safety and to reach back for bed before sitting Stand Pivot Transfers: 1: +2 Total assist;Patient percentage (comment) (Patient = 60%) Stand Pivot Transfer Details (indicate cue type and reason): Difficulty stepping with RLE due to pain in LLE with weightbearing.  Cues for safe use of RW and for sequence to use RW for transfer Ambulation/Gait Ambulation/Gait: No (Patient unable due to pain)  Posture/Postural Control Posture/Postural Control: Postural limitations Postural Limitations: Trunk very flexed during transfer - unable to stand fully upright Exercise    End of Session PT - End of Session Equipment Utilized During Treatment: Gait belt Activity Tolerance: Patient limited by fatigue;Patient limited by pain Patient left: in bed;in CPM;with call bell in reach Nurse Communication: Mobility status for transfers General Behavior During Session: [facility] for tasks performed Cognition: Impaired  Despina Pole [id number] 12/09/2011, 3:04 PM

## 2011-12-09 NOTE — Progress Notes (Signed)
Patient ID: AVALEY STAHMER, female   DOB: 12-24-1924, 77 y.o.   MRN: [id number] PATIENT ID:      Tracey Allen  MRN:     [id number] DOB/AGE:    12-Mar-1925 / 77 y.o.    PROGRESS NOTE Subjective:  negative for Chest Pain  negative for Shortness of Breath  negative for Nausea/Vomiting   negative for Calf Pain  negative for Bowel Movement   Tolerating Diet: yes         Patient reports pain as mild.    Objective: Vital signs in last 24 hours:  Patient Vitals for the past 24 hrs:  BP Temp Temp src Pulse Resp SpO2  12/09/11 0634 114/57 mmHg 100.3 F (37.9 C) Oral 89  18  92 %  12/09/11 0240 121/69 mmHg 99.7 F (37.6 C) Oral 80  20  95 %  12/08/11 2245 113/67 mmHg 99.3 F (37.4 C) Oral 80  18  97 %  12/08/11 1100 131/74 mmHg 98.4 F (36.9 C) Oral 72  16  94 %  12/08/11 1045 - 97.4 F (36.3 C) - - - -  12/08/11 1038 127/48 mmHg - - 69  13  94 %  12/08/11 1023 148/56 mmHg - - 72  10  98 %  12/08/11 1009 116/36 mmHg - - 75  15  95 %  12/08/11 1000 - - - - - 96 %  12/08/11 0953 133/62 mmHg - - 76  15  95 %  12/08/11 0939 - - - 75  24  96 %  12/08/11 0938 138/69 mmHg - - - - -  12/08/11 0936 - 98.7 F (37.1 C) - - - 96 %  12/08/11 0930 138/69 mmHg 97.7 F (36.5 C) - - 15  -      Intake/Output from previous day:   12/24 0701 - 12/25 0700 In: 1800 [I.V.:1800] Out: 2425 [Urine:1725; Drains:700]   Intake/Output this shift:       Intake/Output      12/24 0701 - 12/25 0700 12/25 0701 - 12/26 0700   I.V. 1800    Total Intake 1800    Urine 1725    Drains 700    Total Output 2425    Net -625            LABORATORY DATA:  Basename 12/09/11 0500  WBC 8.1  HGB 8.8*  HCT 26.2*  PLT 152    Basename 12/09/11 0500  NA 135  K 4.2  CL 101  CO2 25  BUN 22  CREATININE 1.21*  GLUCOSE 131*  CALCIUM 8.6   Lab Results  Component Value Date   INR 0.97 12/01/2011    Examination:  General appearance: alert, no distress and mildly obese  Wound Exam: clean, dry,  intact   Drainage:  None: wound tissue dry  Motor Exam EHL, FHL, Anterior Tibial and Posterior Tibial Intact  Sensory Exam Superficial Peroneal, Deep Peroneal and Tibial normal  Assessment:    1 Day Post-Op  Procedure(s) (LRB): TOTAL KNEE ARTHROPLASTY (Left)  ADDITIONAL DIAGNOSIS:  Active Problems:  * No active hospital problems. *   Acute Blood Loss Anemia and Hyperglycemia   Plan: Physical Therapy as ordered Weight Bearing as Tolerated (WBAT)  DVT Prophylaxis:  Lovenox  DISCHARGE PLAN:   DISCHARGE NEEDS: HHPT, Walker and 3-in-1 comode seat         Shaul Trautman W 12/09/2011, 8:34 AM

## 2011-12-10 LAB — BASIC METABOLIC PANEL
CO2: 28 mEq/L (ref 19–32)
Glucose, Bld: 135 mg/dL — ABNORMAL HIGH (ref 70–99)
Potassium: 3.6 mEq/L (ref 3.5–5.1)
Sodium: 138 mEq/L (ref 135–145)

## 2011-12-10 LAB — CBC
Hemoglobin: 9.4 g/dL — ABNORMAL LOW (ref 12.0–15.0)
RBC: 3.15 MIL/uL — ABNORMAL LOW (ref 3.87–5.11)

## 2011-12-10 NOTE — Progress Notes (Signed)
PATIENT ID:      Tracey Allen  MRN:     [id number] DOB/AGE:    75-Oct-1926 / 77 y.o.    PROGRESS NOTE Subjective:  negative for Chest Pain  negative for Shortness of Breath  negative for Nausea/Vomiting   negative for Calf Pain  negative for Bowel Movement   Tolerating Diet: yes         Patient reports pain as 7 on 0-10 scale and 8 on 0-10 scale.    Objective: Vital signs in last 24 hours:  Patient Vitals for the past 24 hrs:  BP Temp Temp src Pulse Resp SpO2  12/10/11 0717 124/48 mmHg 99.3 F (37.4 C) Oral 79  18  98 %  12/09/11 2200 110/65 mmHg 99.1 F (37.3 C) Oral 40  18  95 %  12/09/11 1300 141/63 mmHg 98.2 F (36.8 C) Oral 93  18  91 %      Intake/Output from previous day:   12/25 0701 - 12/26 0700 In: 240 [P.O.:240] Out: 905 [Urine:900; Drains:5]   Intake/Output this shift:       Intake/Output      12/25 0701 - 12/26 0700 12/26 0701 - 12/27 0700   P.O. 240    I.V.     Total Intake 240    Urine 900    Drains 5    Total Output 905    Net -665         Urine Occurrence 5 x 2 x      LABORATORY DATA:  Basename 12/10/11 0500 12/09/11 0500  WBC 9.8 8.1  HGB 9.4* 8.8*  HCT 28.5* 26.2*  PLT 171 152    Basename 12/10/11 0500 12/09/11 0500  NA 138 135  K 3.6 4.2  CL 102 101  CO2 28 25  BUN 17 22  CREATININE 1.29* 1.21*  GLUCOSE 135* 131*  CALCIUM 8.6 8.6   Lab Results  Component Value Date   INR 0.97 12/01/2011    Examination:  General appearance: alert, cooperative and no distress Extremities: Homans sign is negative, no sign of DVT  Wound Exam: clean, dry, intact   Drainage:  Scant/small amount Serosanguinous exudate  Motor Exam EHL and FHL Intact  Sensory Exam Deep Peroneal normal  Assessment:    2 Days Post-Op  Procedure(s) (LRB): TOTAL KNEE ARTHROPLASTY (Left)  ADDITIONAL DIAGNOSIS:  Active Problems:  * No active hospital problems. *   Acute Blood Loss Anemia   Plan: Physical Therapy as ordered Weight Bearing as  Tolerated (WBAT)  DVT Prophylaxis:  Lovenox  DISCHARGE PLAN: Skilled Nursing Facility/Rehab  DISCHARGE NEEDS: HHPT, CPM, Walker and 3-in-1 comode seat         Tracey Allen 12/10/2011, 9:27 AM

## 2011-12-10 NOTE — Progress Notes (Signed)
Physical Therapy Treatment Patient Details Name: Tracey Allen MRN: [id number] DOB: 1925/06/22 Today's Date: 12/10/2011  PT Assessment/Plan  PT - Assessment/Plan Comments on Treatment Session: slow progress, but able to walk today! PT Plan: Discharge plan remains appropriate PT Frequency: 7X/week Follow Up Recommendations: Skilled nursing facility Equipment Recommended: Defer to next venue PT Goals  Acute Rehab PT Goals Time For Goal Achievement: 2 weeks Pt will go Supine/Side to Sit: with min assist;with HOB 0 degrees;with rail PT Goal: Supine/Side to Sit - Progress: Other (comment) Pt will go Sit to Supine/Side: with min assist;with HOB 0 degrees;with rail PT Goal: Sit to Supine/Side - Progress: Other (comment) Pt will go Sit to Stand: with min assist PT Goal: Sit to Stand - Progress: Progressing toward goal Pt will go Stand to Sit: with min assist PT Goal: Stand to Sit - Progress: Progressing toward goal Pt will Transfer Bed to Chair/Chair to Bed: with min assist PT Transfer Goal: Bed to Chair/Chair to Bed - Progress: Progressing toward goal Pt will Ambulate: 16 - 50 feet;with min assist;with rolling walker PT Goal: Ambulate - Progress: Progressing toward goal (may progress quick enough to increase goal)  PT Treatment Precautions/Restrictions  Precautions Precautions: Knee Required Braces or Orthoses: No Restrictions Weight Bearing Restrictions: No LLE Weight Bearing: Weight bearing as tolerated Mobility (including Balance) Transfers Sit to Stand: 1: +2 Total assist (pt=60%) Sit to Stand Details (indicate cue type and reason): cues to initiate, and cues for safe transfer, safe handplacement Stand to Sit: 1: +2 Total assist;Patient percentage (comment) (pt=60%) Stand to Sit Details: cues for safe hand placement Ambulation/Gait Ambulation/Gait: Yes Ambulation/Gait Assistance: 3: Mod assist Ambulation/Gait Assistance Details (indicate cue type and reason): cues and  physical assist for sequence and to step Left foot flat onto floor (tending to supinate); pretty good Left stance stability Ambulation Distance (Feet): 40 Feet Assistive device: Rolling walker Gait Pattern: Step-to pattern Gait velocity: slow    Exercise  Total Joint Exercises Quad Sets: AROM;Left;10 reps Heel Slides: AAROM;Left;10 reps (very limited by pain) End of Session PT - End of Session Equipment Utilized During Treatment: Gait belt Activity Tolerance: Patient limited by fatigue;Patient limited by pain Patient left: in chair;with call bell in reach Nurse Communication: Mobility status for transfers;Mobility status for ambulation General Behavior During Session: [facility] for tasks performed Cognition: [facility] for tasks performed  Roney Marion [facility] [place], [place]  12/10/2011, 1:31 PM

## 2011-12-10 NOTE — Progress Notes (Signed)
CARE MANAGEMENT NOTE 12/10/2011  Discharge planning. Patient is for SNF for shortterm rehab. Social Worker will follow.

## 2011-12-10 NOTE — Progress Notes (Signed)
OT Cancellation Note  Treatment cancelled today due to:  Pt currently disoriented. RN (Bev) present with pt and requesting OT to hold evaluation. OT attempt evaluation 12/10/11. OT attempting * 3 12/10/2011    Veneda Melter   OTR/L Pager: [phone] Office: [phone] .

## 2011-12-11 ENCOUNTER — Encounter (HOSPITAL_COMMUNITY): Payer: Self-pay | Admitting: Orthopedic Surgery

## 2011-12-11 LAB — BASIC METABOLIC PANEL
CO2: 30 mEq/L (ref 19–32)
Calcium: 9 mg/dL (ref 8.4–10.5)
Creatinine, Ser: 1.31 mg/dL — ABNORMAL HIGH (ref 0.50–1.10)
Glucose, Bld: 119 mg/dL — ABNORMAL HIGH (ref 70–99)
Sodium: 139 mEq/L (ref 135–145)

## 2011-12-11 LAB — CBC
Hemoglobin: 9.4 g/dL — ABNORMAL LOW (ref 12.0–15.0)
MCH: 30.4 pg (ref 26.0–34.0)
MCV: 90.6 fL (ref 78.0–100.0)
RBC: 3.09 MIL/uL — ABNORMAL LOW (ref 3.87–5.11)

## 2011-12-11 MED ORDER — OXYCODONE HCL 5 MG PO TABS: 5.0000 mg | ORAL_TABLET | ORAL | Status: AC | PRN

## 2011-12-11 MED ORDER — METHOCARBAMOL 500 MG PO TABS: 500.0000 mg | ORAL_TABLET | Freq: Four times a day (QID) | ORAL | Status: AC | PRN

## 2011-12-11 MED ORDER — ENOXAPARIN SODIUM 40 MG/0.4ML ~~LOC~~ SOLN: 40.0000 mg | SUBCUTANEOUS | Status: DC

## 2011-12-11 NOTE — Progress Notes (Signed)
Physical Therapy Treatment Patient Details Name: Tracey Allen MRN: [id number] DOB: [birth year] Today's Date: 12/11/2011  PT Assessment/Plan  PT - Assessment/Plan Comments on Treatment Session: pt able to ambulate again today, but slow cognition and and difficulty following directions limiting progress.  Per Nsg anticipate pt to D/C to SNF later today.   PT Plan: Discharge plan remains appropriate PT Frequency: 7X/week Follow Up Recommendations: Skilled nursing facility Equipment Recommended: Defer to next venue PT Goals  Acute Rehab PT Goals PT Goal: Supine/Side to Sit - Progress: Not met PT Goal: Sit to Supine/Side - Progress: Not met PT Goal: Sit to Stand - Progress: Progressing toward goal PT Goal: Stand to Sit - Progress: Progressing toward goal PT Transfer Goal: Bed to Chair/Chair to Bed - Progress: Progressing toward goal PT Goal: Ambulate - Progress: Progressing toward goal  PT Treatment Precautions/Restrictions  Precautions Precautions: Knee Required Braces or Orthoses: No Restrictions Weight Bearing Restrictions: Yes LLE Weight Bearing: Weight bearing as tolerated Mobility (including Balance) Bed Mobility Bed Mobility: No Supine to Sit: 2: Max assist;With rails;HOB elevated (Comment degrees) (HOB 30 degrees) Supine to Sit Details (indicate cue type and reason): pt states "let me scoot" pt without any movement. Pt required assistance of the pad  Transfers Transfers: Yes Sit to Stand: 3: Mod assist;With upper extremity assist;From chair/3-in-1 Sit to Stand Details (indicate cue type and reason): cues for UE use on armrests and facilitation for anterior wt shift.   Stand to Sit: 3: Mod assist;With upper extremity assist;To chair/3-in-1 Stand to Sit Details: cues for UEs on armrests and cues to attempt to control descent.   Ambulation/Gait Ambulation/Gait: Yes Ambulation/Gait Assistance: 4: Min assist Ambulation/Gait Assistance Details (indicate cue type and  reason): cues and A for movement of RW, upright posture, sequencing.   Ambulation Distance (Feet): 50 Feet Assistive device: Rolling walker Gait Pattern: Step-to pattern Gait velocity: slow Stairs: No Wheelchair Mobility Wheelchair Mobility: No    Exercise  Total Joint Exercises Ankle Circles/Pumps:  (attempted, however pt having trouble following directions.  ) End of Session PT - End of Session Equipment Utilized During Treatment: Gait belt Activity Tolerance: Patient limited by fatigue (Limited by cognition) Patient left: in chair;with call bell in reach Nurse Communication: Mobility status for transfers;Mobility status for ambulation General Behavior During Session: [facility] for tasks performed Cognition: Impaired  Chares Slaymaker, [place] 12/11/2011, 11:39 AM

## 2011-12-11 NOTE — Progress Notes (Signed)
Occupational Therapy Evaluation Patient Details Name: Tracey Allen MRN: [id number] DOB: [birth year] Today's Date: 12/11/2011  Problem List: There is no problem list on file for this patient.   Past Medical History:  Past Medical History  Diagnosis Date  . GERD (gastroesophageal reflux disease)   . Hypertension   . Arthritis   . Urination, excessive at night   . Stomach ulcer    Past Surgical History:  Past Surgical History  Procedure Date  . Ovarian cyst removal   . Cataract extraction     bilateral    OT Assessment/Plan/Recommendation OT Assessment Clinical Impression Statement: 77 yo female s/p Lt TKA decr cognition, decr activity tolerance that could benefit from skilled OT to maximize independence. OT Recommendation/Assessment: Patient will need skilled OT in the acute care venue OT Problem List: Decreased strength;Decreased activity tolerance;Impaired balance (sitting and/or standing);Decreased cognition;Decreased safety awareness;Decreased knowledge of use of DME or AE;Decreased knowledge of precautions;Pain OT Therapy Diagnosis : Generalized weakness OT Plan OT Frequency: Min 1X/week OT Treatment/Interventions: Self-care/ADL training;Therapeutic activities;Cognitive remediation/compensation;Patient/family education;Balance training;DME and/or AE instruction OT Recommendation Follow Up Recommendations: Skilled nursing facility Equipment Recommended: Defer to next venue Individuals Consulted Consulted and Agree with Results and Recommendations: Patient;Family member/caregiver OT Goals Acute Rehab OT Goals OT Goal Formulation: With patient/family Time For Goal Achievement: 2 weeks ADL Goals Pt Will Perform Grooming: with modified independence;Standing at sink Pt Will Perform Upper Body Bathing: with modified independence;Sit to stand from chair Pt Will Perform Lower Body Bathing: with min assist;Sit to stand from bed;Sit to stand from chair Pt Will Perform Upper  Body Dressing: with modified independence;Sit to stand from chair;Sit to stand from bed Pt Will Perform Lower Body Dressing: with modified independence;Sit to stand from chair;Sit to stand from bed Pt Will Transfer to Toilet: with min assist;3-in-1 Pt Will Perform Toileting - Clothing Manipulation: with min assist;Sitting on 3-in-1 or toilet Pt Will Perform Toileting - Hygiene: with set-up;Sit to stand from 3-in-1/toilet Miscellaneous OT Goals Miscellaneous OT Goal #1: pt will perform bed mobility with Min A hob flat as precursor to adls  OT Evaluation Precautions/Restrictions  Precautions Precautions: Knee Required Braces or Orthoses: No Restrictions Weight Bearing Restrictions: No LLE Weight Bearing: Weight bearing as tolerated Prior Functioning Home Living Lives With: Alone Type of Home: House Home Layout: One level Home Access: Level entry Additional Comments: Plan is to go to [place] for therapy at discharge per patient  Prior Function Level of Independence: Independent with basic ADLs;Independent with homemaking with ambulation;Independent with gait Driving: Yes Vocation: Retired ADL ADL Eating/Feeding: Performed;Set up Where Assessed - Eating/Feeding: Bed level Grooming: Performed;Wash/dry face;Teeth care;Minimal assistance;Other (comment) (mod v/c delayed progressing and extended time required) Grooming Details (indicate cue type and reason): pt attempting to brush teeth without tooth paste after opening tooth paste. Pt demonstrates cognitive deficits Where Assessed - Grooming: Standing at sink Toilet Transfer: Simulated;Moderate assistance Toilet Transfer Method: Stand pivot;Ambulating Toilet Transfer Equipment: Raised toilet seat with arms (or 3-in-1 over toilet) Equipment Used: Rolling walker ADL Comments: PT will require min A with UB dressing and Max A with LB dressing/bathing. Pt requires verbal cues and visual cues due to cognitive deficitis  currently. Pt HOH and just grins at therapist. Pt does not ask for therapist to repeat or speak up if instructions are not clearly heard. Pt requires repetition of instructions to ensure that pt heard directions to task. Vision/Perception  Vision - History Baseline Vision: Bifocals Patient Visual Report: No change from baseline Cognition Cognition  Arousal/Alertness: Awake/alert Overall Cognitive Status: Impaired Attention: Impaired Current Attention Level: Sustained Attention - Other Comments: Needed redirection to task Memory: Appears impaired Orientation Level: Oriented X4 Following Commands: Follows one step commands with increased time Problem Solving: Requires assistance for problem solving Sensation/Coordination Coordination Gross Motor Movements are Fluid and Coordinated: Yes Fine Motor Movements are Fluid and Coordinated: Yes Extremity Assessment RUE Assessment RUE Assessment: Within Functional Limits LUE Assessment LUE Assessment: Within Functional Limits Mobility  Bed Mobility Bed Mobility: Yes Supine to Sit: 2: Max assist;With rails;HOB elevated (Comment degrees) (HOB 30 degrees) Supine to Sit Details (indicate cue type and reason): pt states "let me scoot" pt without any movement. Pt required assistance of the pad  Transfers Transfers: Yes Sit to Stand: 2: Max assist;From bed;Without upper extremity assist Sit to Stand Details (indicate cue type and reason): mod v/c for hand placement and min v/c to position LLE underneath pt prior to ambulation. pt with no recall of sequence to utilize RW. Pt provided verbal instructions out loud with ambulation to sink (walk Left Right walker) Stand to Sit: 2: Max assist;With upper extremity assist;To chair/3-in-1 Stand to Sit Details: pt requires A to control descend to chair Exercises   End of Session OT - End of Session Equipment Utilized During Treatment: Gait belt Activity Tolerance: Patient limited by fatigue (pt fatigued  with sink level task) Patient left: in chair;with call bell in reach;with family/visitor present (dgter present) Nurse Communication: Mobility status for transfers;Mobility status for ambulation General Behavior During Session: [facility] for tasks performed Cognition: Impaired   Sharol Harness [facility] 12/11/2011, 10:12 AM  Pager: [phone]

## 2011-12-11 NOTE — Progress Notes (Signed)
PATIENT ID:      Tracey Allen  MRN:     [id number] DOB/AGE:    June 13, 1925 / 77 y.o.    PROGRESS NOTE Subjective:  negative for Chest Pain  negative for Shortness of Breath  negative for Nausea/Vomiting   negative for Calf Pain  negative for Bowel Movement   Tolerating Diet: yes         Patient reports pain as 4 on 0-10 scale.    Objective: Vital signs in last 24 hours:  Patient Vitals for the past 24 hrs:  BP Temp Temp src Pulse Resp SpO2  12/11/11 1021 108/74 mmHg 97.3 F (36.3 C) Oral 122  20  94 %  12/11/11 0607 127/71 mmHg 97 F (36.1 C) - 110  20  100 %  12/10/11 2217 124/64 mmHg 100.5 F (38.1 C) - 81  20  100 %  12/10/11 1404 114/52 mmHg 100.3 F (37.9 C) - 87  16  100 %      Intake/Output from previous day:   12/26 0701 - 12/27 0700 In: 960 [P.O.:960] Out: -    Intake/Output this shift:       Intake/Output      12/26 0701 - 12/27 0700 12/27 0701 - 12/28 0700   P.O. 960    Total Intake 960    Urine     Drains     Total Output     Net +960         Urine Occurrence 9 x       LABORATORY DATA:  Basename 12/11/11 0555 12/10/11 0500 12/09/11 0500  WBC 7.7 9.8 8.1  HGB 9.4* 9.4* 8.8*  HCT 28.0* 28.5* 26.2*  PLT 169 171 152    Basename 12/11/11 0555 12/10/11 0500 12/09/11 0500  NA 139 138 135  K 3.7 3.6 4.2  CL 100 102 101  CO2 30 28 25   BUN 21 17 22   CREATININE 1.31* 1.29* 1.21*  GLUCOSE 119* 135* 131*  CALCIUM 9.0 8.6 8.6   Lab Results  Component Value Date   INR 0.97 12/01/2011    Examination:  General appearance: alert, cooperative and no distress Extremities: extremities normal, atraumatic, no cyanosis or edema  Wound Exam: clean, dry, intact   Drainage:  None: wound tissue dry  Motor Exam EHL and FHL Intact  Sensory Exam Deep Peroneal normal  Assessment:    3 Days Post-Op  Procedure(s) (LRB): TOTAL KNEE ARTHROPLASTY (Left)  ADDITIONAL DIAGNOSIS:  Active Problems:  * No active hospital problems. *   Acute Blood Loss  Anemia   Plan: Physical Therapy as ordered Weight Bearing as Tolerated (WBAT)  DVT Prophylaxis:  Lovenox  DISCHARGE PLAN: Skilled Nursing Facility/Rehab  DISCHARGE NEEDS: HHPT, CPM, Walker and 3-in-1 comode seat         Brandom Kerwin 12/11/2011, 12:12 PM

## 2011-12-11 NOTE — Plan of Care (Signed)
Problem: Phase I Progression Outcomes Goal: Other Phase I Outcomes/Goals Outcome: Progressing Pt demonstrates cognitive deficits during session . See OT evaluation 12/11/11

## 2011-12-11 NOTE — Progress Notes (Signed)
Patient referred for short term SNF. Daughter- Jamesetta Orleans called and stated that she is an Admissions Coordinator at [facility] and has arranged for her mother to be placed there. States that MD stated patient will be d/c'd today. Awaiting d/c order. Referral completed to SNF; Yellow assessment completed and placed on shadow chart along with FL2 for MD's signature.  Williemae Area, BSW, 12/11/2011 10:44 AM

## 2011-12-11 NOTE — Discharge Summary (Signed)
PATIENT ID:      Tracey Allen  MRN:     [id number] DOB/AGE:    75-May-1926 / 75 y.o.     DISCHARGE SUMMARY  ADMISSION DATE:    12/08/2011 DISCHARGE DATE:   12/11/2011   ADMISSION DIAGNOSIS: OA Left Knee  (OA Left Knee)  DISCHARGE DIAGNOSIS:  OA Left Knee    ADDITIONAL DIAGNOSIS: Active Problems:  * No active hospital problems. *   Past Medical History  Diagnosis Date  . GERD (gastroesophageal reflux disease)   . Hypertension   . Arthritis   . Urination, excessive at night   . Stomach ulcer     PROCEDURE: Procedure(s): TOTAL KNEE ARTHROPLASTY on 12/08/2011  CONSULTS:     HISTORY:  See H&P in chart  HOSPITAL COURSE:  Tracey Allen is a 77 y.o. admitted on 12/08/2011 and found to have a diagnosis of OA Left Knee.  After appropriate laboratory studies were obtained  they were taken to the operating room on 12/08/2011 and underwent Procedure(s): TOTAL KNEE ARTHROPLASTY.   They were given perioperative antibiotics:  Anti-infectives     Start     Dose/Rate Route Frequency Ordered Stop   12/08/11 1300   vancomycin (VANCOCIN) IVPB 1000 mg/200 mL premix        1,000 mg 200 mL/hr over 60 Minutes Intravenous Every 12 hours 12/08/11 1208 12/08/11 1439   12/07/11 1330   vancomycin (VANCOCIN) IVPB 1000 mg/200 mL premix  Status:  Discontinued        1,000 mg 200 mL/hr over 60 Minutes Intravenous 60 min pre-op 12/07/11 1326 12/08/11 1135        . Blood products given:none   The remainder of the hospital course was dedicated to ambulation and strengthening.   The patient was discharged on 3 Days Post-Op in  Good condition.   DIAGNOSTIC STUDIES: Recent vital signs: Patient Vitals for the past 24 hrs:  BP Temp Temp src Pulse Resp SpO2  12/11/11 1021 108/74 mmHg 97.3 F (36.3 C) Oral 122  20  94 %  12/11/11 0607 127/71 mmHg 97 F (36.1 C) - 110  20  100 %  2011/12/23 2217 124/64 mmHg 100.5 F (38.1 C) - 81  20  100 %  23-Dec-2011 1404 114/52 mmHg 100.3 F (37.9 C) - 87   16  100 %       Recent laboratory studies:  Basename 12/11/11 0555 12-23-2011 0500 12/09/11 0500  WBC 7.7 9.8 8.1  HGB 9.4* 9.4* 8.8*  HCT 28.0* 28.5* 26.2*  PLT 169 171 152    Basename 12/11/11 0555 2011/12/23 0500 12/09/11 0500  NA 139 138 135  K 3.7 3.6 4.2  CL 100 102 101  CO2 30 28 25   BUN 21 17 22   CREATININE 1.31* 1.29* 1.21*  GLUCOSE 119* 135* 131*  CALCIUM 9.0 8.6 8.6   Lab Results  Component Value Date   INR 0.97 12/01/2011     Recent Radiographic Studies :  Dg Chest 2 View  12/08/2011  *RADIOLOGY REPORT*  Clinical Data: Preoperative chest radiograph for left total knee arthroplasty.  History of smoking.  CHEST - 2 VIEW  Comparison: Chest radiograph performed 04/29/2004  Findings: The lungs are mildly hyperexpanded with flattening of the hemidiaphragms, raising suspicion for mild COPD.  There is no evidence of focal opacification, pleural effusion or pneumothorax.  The heart is borderline normal in size; calcification is noted within the aortic arch.  No acute osseous abnormalities are seen.  IMPRESSION: Suspect  mild COPD; no acute cardiopulmonary process seen.  Original Report Authenticated By: Santa Lighter, M.D.    DISCHARGE INSTRUCTIONS: Discharge Orders    Future Orders Please Complete By Expires   Diet - low sodium heart healthy      Call MD / Call 911      Comments:   If you experience chest pain or shortness of breath, CALL 911 and be transported to the hospital emergency room.  If you develope a fever above 101 F, pus (white drainage) or increased drainage or redness at the wound, or calf pain, call your surgeon's office.   Constipation Prevention      Comments:   Drink plenty of fluids.  Prune juice may be helpful.  You may use a stool softener, such as Colace (over the counter) 100 mg twice a day.  Use MiraLax (over the counter) for constipation as needed.   Increase activity slowly as tolerated      Weight Bearing as taught in Physical Therapy       Comments:   Use a walker or crutches as instructed.   Driving restrictions      Comments:   No driving for 6 weeks   Lifting restrictions      Comments:   No lifting for 6 weeks   CPM      Comments:   Continuous passive motion machine (CPM):      Use the CPM from 0 to 90 for 6-8 hours per day.      You may increase by 10 per day.  You may break it up into 2 or 3 sessions per day.      Use CPM for 2 weeks or until you are told to stop.   TED hose      Comments:   Use stockings (TED hose) for 3 weeks on both leg(s).  You may remove them at night for sleeping.   Change dressing      Comments:   Change dressing on friday, then change the dressing daily with sterile 4 x 4 inch gauze dressing and apply TED hose.  You may clean the incision with alcohol prior to redressing.   Do not put a pillow under the knee. Place it under the heel.         DISCHARGE MEDICATIONS:  Current Discharge Medication List    START taking these medications   Details  enoxaparin (LOVENOX) 40 MG/0.4ML SOLN Inject 0.4 mLs (40 mg total) into the skin daily. Qty: 10 Syringe, Refills: 0    methocarbamol (ROBAXIN) 500 MG tablet Take 1 tablet (500 mg total) by mouth every 6 (six) hours as needed. Qty: 60 tablet, Refills: 0    oxyCODONE (OXY IR/ROXICODONE) 5 MG immediate release tablet Take 1-2 tablets (5-10 mg total) by mouth every 3 (three) hours as needed. Qty: 60 tablet, Refills: 0      CONTINUE these medications which have NOT CHANGED   Details  amLODipine-olmesartan (AZOR) 10-40 MG per tablet Take 1 tablet by mouth daily.      Ascorbic Acid (VITAMIN C PO) Take 1 tablet by mouth daily.      chlorthalidone (HYGROTON) 25 MG tablet Take 25 mg by mouth daily.      Cyanocobalamin (B-12 PO) Take 1 tablet by mouth daily.      hydrochlorothiazide (HYDRODIURIL) 50 MG tablet Take 50 mg by mouth daily.      lisinopril (PRINIVIL,ZESTRIL) 20 MG tablet Take 20 mg by mouth daily.  omeprazole (PRILOSEC) 20 MG  capsule Take 20 mg by mouth 2 (two) times daily.          FOLLOW UP VISIT:   Follow-up Information    Follow up with Rudean Haskell, MD. Call on 12/23/2011.   Contact information:   [place] [phone]          DISPOSITION:  Skilled nursing facility  Final discharge disposition not confirmed  CONDITION:  Good   Lamone Ferrelli 12/11/2011, 12:20 PM     PATIENT ID:      KARINNE STUVER  MRN:     [id number] DOB/AGE:    [birth year] / 77 y.o.     DISCHARGE SUMMARY  ADMISSION DATE:    12/08/2011 DISCHARGE DATE:   12/11/2011   ADMISSION DIAGNOSIS: OA Left Knee  (OA Left Knee)  DISCHARGE DIAGNOSIS:  OA Left Knee    ADDITIONAL DIAGNOSIS: Active Problems:  * No active hospital problems. *   Past Medical History  Diagnosis Date  . GERD (gastroesophageal reflux disease)   . Hypertension   . Arthritis   . Urination, excessive at night   . Stomach ulcer     PROCEDURE: Procedure(s): TOTAL KNEE ARTHROPLASTY on 12/08/2011  CONSULTS:     HISTORY:  See H&P in chart  HOSPITAL COURSE:  KALIAH COPPES is a 77 y.o. admitted on 12/08/2011 and found to have a diagnosis of OA Left Knee.  After appropriate laboratory studies were obtained  they were taken to the operating room on 12/08/2011 and underwent Procedure(s): TOTAL KNEE ARTHROPLASTY.   They were given perioperative antibiotics:  Anti-infectives     Start     Dose/Rate Route Frequency Ordered Stop   12/08/11 1300   vancomycin (VANCOCIN) IVPB 1000 mg/200 mL premix        1,000 mg 200 mL/hr over 60 Minutes Intravenous Every 12 hours 12/08/11 1208 12/08/11 1439   12/07/11 1330   vancomycin (VANCOCIN) IVPB 1000 mg/200 mL premix  Status:  Discontinued        1,000 mg 200 mL/hr over 60 Minutes Intravenous 60 min pre-op 12/07/11 1326 12/08/11 1135        . Blood products given:none   The remainder of the hospital course was dedicated to ambulation and strengthening.    The patient was discharged on 3 Days Post-Op in  Good condition.   DIAGNOSTIC STUDIES: Recent vital signs: Patient Vitals for the past 24 hrs:  BP Temp Temp src Pulse Resp SpO2  12/11/11 1021 108/74 mmHg 97.3 F (36.3 C) Oral 122  20  94 %  12/11/11 0607 127/71 mmHg 97 F (36.1 C) - 110  20  100 %  January 07, 2012 2217 124/64 mmHg 100.5 F (38.1 C) - 81  20  100 %  01/07/2012 1404 114/52 mmHg 100.3 F (37.9 C) - 87  16  100 %       Recent laboratory studies:  Basename 12/11/11 0555 2012/01/07 0500 12/09/11 0500  WBC 7.7 9.8 8.1  HGB 9.4* 9.4* 8.8*  HCT 28.0* 28.5* 26.2*  PLT 169 171 152    Basename 12/11/11 0555 2012-01-07 0500 12/09/11 0500  NA 139 138 135  K 3.7 3.6 4.2  CL 100 102 101  CO2 30 28 25   BUN 21 17 22   CREATININE 1.31* 1.29* 1.21*  GLUCOSE 119* 135* 131*  CALCIUM 9.0 8.6 8.6   Lab Results  Component Value Date   INR 0.97 12/01/2011     Recent Radiographic  Studies :  Dg Chest 2 View  12/08/2011  *RADIOLOGY REPORT*  Clinical Data: Preoperative chest radiograph for left total knee arthroplasty.  History of smoking.  CHEST - 2 VIEW  Comparison: Chest radiograph performed 04/29/2004  Findings: The lungs are mildly hyperexpanded with flattening of the hemidiaphragms, raising suspicion for mild COPD.  There is no evidence of focal opacification, pleural effusion or pneumothorax.  The heart is borderline normal in size; calcification is noted within the aortic arch.  No acute osseous abnormalities are seen.  IMPRESSION: Suspect mild COPD; no acute cardiopulmonary process seen.  Original Report Authenticated By: Santa Lighter, M.D.    DISCHARGE INSTRUCTIONS: Discharge Orders    Future Orders Please Complete By Expires   Diet - low sodium heart healthy      Call MD / Call 911      Comments:   If you experience chest pain or shortness of breath, CALL 911 and be transported to the hospital emergency room.  If you develope a fever above 101 F, pus (white drainage) or increased  drainage or redness at the wound, or calf pain, call your surgeon's office.   Constipation Prevention      Comments:   Drink plenty of fluids.  Prune juice may be helpful.  You may use a stool softener, such as Colace (over the counter) 100 mg twice a day.  Use MiraLax (over the counter) for constipation as needed.   Increase activity slowly as tolerated      Weight Bearing as taught in Physical Therapy      Comments:   Use a walker or crutches as instructed.   Driving restrictions      Comments:   No driving for 6 weeks   Lifting restrictions      Comments:   No lifting for 6 weeks   CPM      Comments:   Continuous passive motion machine (CPM):      Use the CPM from 0 to 90 for 6-8 hours per day.      You may increase by 10 per day.  You may break it up into 2 or 3 sessions per day.      Use CPM for 2 weeks or until you are told to stop.   TED hose      Comments:   Use stockings (TED hose) for 3 weeks on both leg(s).  You may remove them at night for sleeping.   Change dressing      Comments:   Change dressing on friday, then change the dressing daily with sterile 4 x 4 inch gauze dressing and apply TED hose.  You may clean the incision with alcohol prior to redressing.   Do not put a pillow under the knee. Place it under the heel.         DISCHARGE MEDICATIONS:  Current Discharge Medication List    START taking these medications   Details  enoxaparin (LOVENOX) 40 MG/0.4ML SOLN Inject 0.4 mLs (40 mg total) into the skin daily. Qty: 10 Syringe, Refills: 0    methocarbamol (ROBAXIN) 500 MG tablet Take 1 tablet (500 mg total) by mouth every 6 (six) hours as needed. Qty: 60 tablet, Refills: 0    oxyCODONE (OXY IR/ROXICODONE) 5 MG immediate release tablet Take 1-2 tablets (5-10 mg total) by mouth every 3 (three) hours as needed. Qty: 60 tablet, Refills: 0      CONTINUE these medications which have NOT CHANGED   Details  amLODipine-olmesartan (AZOR)  10-40 MG per tablet Take 1  tablet by mouth daily.      Ascorbic Acid (VITAMIN C PO) Take 1 tablet by mouth daily.      chlorthalidone (HYGROTON) 25 MG tablet Take 25 mg by mouth daily.      Cyanocobalamin (B-12 PO) Take 1 tablet by mouth daily.      hydrochlorothiazide (HYDRODIURIL) 50 MG tablet Take 50 mg by mouth daily.      lisinopril (PRINIVIL,ZESTRIL) 20 MG tablet Take 20 mg by mouth daily.      omeprazole (PRILOSEC) 20 MG capsule Take 20 mg by mouth 2 (two) times daily.          FOLLOW UP VISIT:   Follow-up Information    Follow up with Rudean Haskell, MD. Call on 12/23/2011.   Contact information:   [place] [phone]          DISPOSITION:  Home  Final discharge disposition not confirmed  CONDITION:  Good   Avereigh Spainhower 12/11/2011, 12:20 PM

## 2011-12-11 NOTE — Progress Notes (Signed)
DC to [facility] today via EMS. OK per patient and daughter. Nursing and SNF  Notified of d/c plan. No further SW intervention indicated.  Kendell Bane T, BSW,12/11/2011 1:18 PM

## 2014-12-22 DIAGNOSIS — H26492 Other secondary cataract, left eye: Secondary | ICD-10-CM | POA: Diagnosis not present

## 2015-01-11 DIAGNOSIS — H3532 Exudative age-related macular degeneration: Secondary | ICD-10-CM | POA: Diagnosis not present

## 2015-03-01 DIAGNOSIS — H3532 Exudative age-related macular degeneration: Secondary | ICD-10-CM | POA: Diagnosis not present

## 2015-03-15 DIAGNOSIS — E785 Hyperlipidemia, unspecified: Secondary | ICD-10-CM | POA: Diagnosis not present

## 2015-04-05 DIAGNOSIS — H3532 Exudative age-related macular degeneration: Secondary | ICD-10-CM | POA: Diagnosis not present

## 2015-06-11 DIAGNOSIS — H3530 Unspecified macular degeneration: Secondary | ICD-10-CM | POA: Diagnosis not present

## 2015-06-11 DIAGNOSIS — I10 Essential (primary) hypertension: Secondary | ICD-10-CM | POA: Diagnosis not present

## 2015-06-21 DIAGNOSIS — H3532 Exudative age-related macular degeneration: Secondary | ICD-10-CM | POA: Diagnosis not present

## 2015-07-19 DIAGNOSIS — H3532 Exudative age-related macular degeneration: Secondary | ICD-10-CM | POA: Diagnosis not present

## 2015-08-23 DIAGNOSIS — H3532 Exudative age-related macular degeneration: Secondary | ICD-10-CM | POA: Diagnosis not present

## 2015-09-14 DIAGNOSIS — I2510 Atherosclerotic heart disease of native coronary artery without angina pectoris: Secondary | ICD-10-CM | POA: Diagnosis not present

## 2015-09-14 DIAGNOSIS — E785 Hyperlipidemia, unspecified: Secondary | ICD-10-CM | POA: Diagnosis not present

## 2015-09-14 DIAGNOSIS — I480 Paroxysmal atrial fibrillation: Secondary | ICD-10-CM | POA: Diagnosis not present

## 2015-09-14 DIAGNOSIS — Z1389 Encounter for screening for other disorder: Secondary | ICD-10-CM | POA: Diagnosis not present

## 2015-09-14 DIAGNOSIS — Z0000 Encounter for general adult medical examination without abnormal findings: Secondary | ICD-10-CM | POA: Diagnosis not present

## 2015-09-14 DIAGNOSIS — I10 Essential (primary) hypertension: Secondary | ICD-10-CM | POA: Diagnosis not present

## 2015-09-14 DIAGNOSIS — N183 Chronic kidney disease, stage 3 (moderate): Secondary | ICD-10-CM | POA: Diagnosis not present

## 2015-09-14 DIAGNOSIS — E559 Vitamin D deficiency, unspecified: Secondary | ICD-10-CM | POA: Diagnosis not present

## 2015-09-23 DIAGNOSIS — I4892 Unspecified atrial flutter: Secondary | ICD-10-CM | POA: Diagnosis not present

## 2015-09-23 DIAGNOSIS — R002 Palpitations: Secondary | ICD-10-CM | POA: Diagnosis not present

## 2015-09-24 DIAGNOSIS — Z1212 Encounter for screening for malignant neoplasm of rectum: Secondary | ICD-10-CM | POA: Diagnosis not present

## 2015-09-24 DIAGNOSIS — I4891 Unspecified atrial fibrillation: Secondary | ICD-10-CM | POA: Diagnosis not present

## 2015-10-01 DIAGNOSIS — M791 Myalgia: Secondary | ICD-10-CM | POA: Diagnosis not present

## 2015-10-11 DIAGNOSIS — H353221 Exudative age-related macular degeneration, left eye, with active choroidal neovascularization: Secondary | ICD-10-CM | POA: Diagnosis not present

## 2015-10-18 DIAGNOSIS — H353221 Exudative age-related macular degeneration, left eye, with active choroidal neovascularization: Secondary | ICD-10-CM | POA: Diagnosis not present

## 2015-11-01 DIAGNOSIS — S0990XA Unspecified injury of head, initial encounter: Secondary | ICD-10-CM | POA: Diagnosis not present

## 2015-11-01 DIAGNOSIS — S299XXA Unspecified injury of thorax, initial encounter: Secondary | ICD-10-CM | POA: Diagnosis not present

## 2015-11-01 DIAGNOSIS — I4891 Unspecified atrial fibrillation: Secondary | ICD-10-CM | POA: Diagnosis not present

## 2015-11-12 DIAGNOSIS — I4819 Other persistent atrial fibrillation: Secondary | ICD-10-CM

## 2015-11-12 DIAGNOSIS — R001 Bradycardia, unspecified: Secondary | ICD-10-CM

## 2015-11-12 DIAGNOSIS — I482 Chronic atrial fibrillation, unspecified: Secondary | ICD-10-CM

## 2015-11-12 DIAGNOSIS — Z7901 Long term (current) use of anticoagulants: Secondary | ICD-10-CM

## 2015-11-12 HISTORY — DX: Other persistent atrial fibrillation: I48.19

## 2015-11-12 HISTORY — DX: Chronic atrial fibrillation, unspecified: I48.20

## 2015-11-12 HISTORY — DX: Long term (current) use of anticoagulants: Z79.01

## 2015-11-12 HISTORY — DX: Bradycardia, unspecified: R00.1

## 2015-11-13 DIAGNOSIS — I4891 Unspecified atrial fibrillation: Secondary | ICD-10-CM | POA: Diagnosis not present

## 2015-11-13 DIAGNOSIS — R195 Other fecal abnormalities: Secondary | ICD-10-CM | POA: Diagnosis not present

## 2015-11-14 DIAGNOSIS — I4891 Unspecified atrial fibrillation: Secondary | ICD-10-CM | POA: Diagnosis not present

## 2015-11-14 DIAGNOSIS — Z7902 Long term (current) use of antithrombotics/antiplatelets: Secondary | ICD-10-CM | POA: Diagnosis not present

## 2015-11-14 DIAGNOSIS — F329 Major depressive disorder, single episode, unspecified: Secondary | ICD-10-CM | POA: Diagnosis not present

## 2015-11-14 DIAGNOSIS — I480 Paroxysmal atrial fibrillation: Secondary | ICD-10-CM | POA: Diagnosis not present

## 2015-11-14 DIAGNOSIS — R000 Tachycardia, unspecified: Secondary | ICD-10-CM | POA: Diagnosis not present

## 2015-11-14 DIAGNOSIS — I5030 Unspecified diastolic (congestive) heart failure: Secondary | ICD-10-CM | POA: Diagnosis not present

## 2015-11-14 DIAGNOSIS — R001 Bradycardia, unspecified: Secondary | ICD-10-CM | POA: Diagnosis not present

## 2015-11-14 DIAGNOSIS — I358 Other nonrheumatic aortic valve disorders: Secondary | ICD-10-CM | POA: Diagnosis not present

## 2015-11-14 DIAGNOSIS — I119 Hypertensive heart disease without heart failure: Secondary | ICD-10-CM | POA: Diagnosis not present

## 2015-11-14 DIAGNOSIS — I10 Essential (primary) hypertension: Secondary | ICD-10-CM | POA: Diagnosis not present

## 2015-11-14 DIAGNOSIS — R42 Dizziness and giddiness: Secondary | ICD-10-CM | POA: Diagnosis not present

## 2015-11-14 DIAGNOSIS — Z79899 Other long term (current) drug therapy: Secondary | ICD-10-CM | POA: Diagnosis not present

## 2015-11-14 DIAGNOSIS — K219 Gastro-esophageal reflux disease without esophagitis: Secondary | ICD-10-CM | POA: Diagnosis not present

## 2015-11-22 DIAGNOSIS — Z23 Encounter for immunization: Secondary | ICD-10-CM | POA: Diagnosis not present

## 2015-11-22 DIAGNOSIS — I480 Paroxysmal atrial fibrillation: Secondary | ICD-10-CM | POA: Diagnosis not present

## 2015-11-22 DIAGNOSIS — I10 Essential (primary) hypertension: Secondary | ICD-10-CM | POA: Diagnosis not present

## 2015-11-29 DIAGNOSIS — H353221 Exudative age-related macular degeneration, left eye, with active choroidal neovascularization: Secondary | ICD-10-CM | POA: Diagnosis not present

## 2015-12-04 DIAGNOSIS — Z7901 Long term (current) use of anticoagulants: Secondary | ICD-10-CM | POA: Diagnosis not present

## 2015-12-04 DIAGNOSIS — N183 Chronic kidney disease, stage 3 (moderate): Secondary | ICD-10-CM | POA: Diagnosis not present

## 2015-12-04 DIAGNOSIS — I481 Persistent atrial fibrillation: Secondary | ICD-10-CM | POA: Diagnosis not present

## 2015-12-04 DIAGNOSIS — I110 Hypertensive heart disease with heart failure: Secondary | ICD-10-CM | POA: Diagnosis not present

## 2015-12-04 DIAGNOSIS — I10 Essential (primary) hypertension: Secondary | ICD-10-CM | POA: Diagnosis not present

## 2015-12-14 DIAGNOSIS — Z791 Long term (current) use of non-steroidal anti-inflammatories (NSAID): Secondary | ICD-10-CM | POA: Diagnosis not present

## 2015-12-14 DIAGNOSIS — K449 Diaphragmatic hernia without obstruction or gangrene: Secondary | ICD-10-CM | POA: Diagnosis not present

## 2015-12-14 DIAGNOSIS — Z7901 Long term (current) use of anticoagulants: Secondary | ICD-10-CM | POA: Diagnosis not present

## 2015-12-14 DIAGNOSIS — D128 Benign neoplasm of rectum: Secondary | ICD-10-CM | POA: Diagnosis not present

## 2015-12-14 DIAGNOSIS — R195 Other fecal abnormalities: Secondary | ICD-10-CM | POA: Diagnosis not present

## 2015-12-14 DIAGNOSIS — K5730 Diverticulosis of large intestine without perforation or abscess without bleeding: Secondary | ICD-10-CM | POA: Diagnosis not present

## 2015-12-14 DIAGNOSIS — K635 Polyp of colon: Secondary | ICD-10-CM | POA: Diagnosis not present

## 2015-12-20 DIAGNOSIS — I482 Chronic atrial fibrillation: Secondary | ICD-10-CM | POA: Diagnosis not present

## 2015-12-27 DIAGNOSIS — I482 Chronic atrial fibrillation: Secondary | ICD-10-CM | POA: Diagnosis not present

## 2015-12-27 DIAGNOSIS — I10 Essential (primary) hypertension: Secondary | ICD-10-CM | POA: Diagnosis not present

## 2016-01-10 DIAGNOSIS — H353221 Exudative age-related macular degeneration, left eye, with active choroidal neovascularization: Secondary | ICD-10-CM | POA: Diagnosis not present

## 2016-01-25 DIAGNOSIS — M5416 Radiculopathy, lumbar region: Secondary | ICD-10-CM | POA: Diagnosis not present

## 2016-01-25 DIAGNOSIS — R079 Chest pain, unspecified: Secondary | ICD-10-CM | POA: Diagnosis not present

## 2016-01-25 DIAGNOSIS — R0602 Shortness of breath: Secondary | ICD-10-CM | POA: Diagnosis not present

## 2016-02-01 DIAGNOSIS — I10 Essential (primary) hypertension: Secondary | ICD-10-CM | POA: Diagnosis not present

## 2016-02-14 DIAGNOSIS — H353222 Exudative age-related macular degeneration, left eye, with inactive choroidal neovascularization: Secondary | ICD-10-CM | POA: Diagnosis not present

## 2016-03-27 DIAGNOSIS — H353222 Exudative age-related macular degeneration, left eye, with inactive choroidal neovascularization: Secondary | ICD-10-CM | POA: Diagnosis not present

## 2016-03-31 DIAGNOSIS — I517 Cardiomegaly: Secondary | ICD-10-CM | POA: Diagnosis not present

## 2016-03-31 DIAGNOSIS — I700 Atherosclerosis of aorta: Secondary | ICD-10-CM | POA: Diagnosis not present

## 2016-03-31 DIAGNOSIS — M546 Pain in thoracic spine: Secondary | ICD-10-CM | POA: Diagnosis not present

## 2016-03-31 DIAGNOSIS — M4186 Other forms of scoliosis, lumbar region: Secondary | ICD-10-CM | POA: Diagnosis not present

## 2016-03-31 DIAGNOSIS — R0789 Other chest pain: Secondary | ICD-10-CM | POA: Diagnosis not present

## 2016-03-31 DIAGNOSIS — R918 Other nonspecific abnormal finding of lung field: Secondary | ICD-10-CM | POA: Diagnosis not present

## 2016-04-07 DIAGNOSIS — I482 Chronic atrial fibrillation: Secondary | ICD-10-CM | POA: Diagnosis not present

## 2016-04-07 DIAGNOSIS — M79605 Pain in left leg: Secondary | ICD-10-CM | POA: Diagnosis not present

## 2016-04-07 DIAGNOSIS — M79604 Pain in right leg: Secondary | ICD-10-CM | POA: Diagnosis not present

## 2016-04-07 DIAGNOSIS — I10 Essential (primary) hypertension: Secondary | ICD-10-CM | POA: Diagnosis not present

## 2016-04-21 DIAGNOSIS — M25552 Pain in left hip: Secondary | ICD-10-CM | POA: Diagnosis not present

## 2016-04-21 DIAGNOSIS — Z96652 Presence of left artificial knee joint: Secondary | ICD-10-CM | POA: Diagnosis not present

## 2016-04-21 DIAGNOSIS — M25562 Pain in left knee: Secondary | ICD-10-CM | POA: Diagnosis not present

## 2016-04-21 DIAGNOSIS — M85862 Other specified disorders of bone density and structure, left lower leg: Secondary | ICD-10-CM | POA: Diagnosis not present

## 2016-04-21 DIAGNOSIS — M179 Osteoarthritis of knee, unspecified: Secondary | ICD-10-CM | POA: Diagnosis not present

## 2016-04-21 DIAGNOSIS — M160 Bilateral primary osteoarthritis of hip: Secondary | ICD-10-CM | POA: Diagnosis not present

## 2016-04-21 DIAGNOSIS — M25551 Pain in right hip: Secondary | ICD-10-CM | POA: Diagnosis not present

## 2016-04-21 DIAGNOSIS — M85861 Other specified disorders of bone density and structure, right lower leg: Secondary | ICD-10-CM | POA: Diagnosis not present

## 2016-04-21 DIAGNOSIS — M25561 Pain in right knee: Secondary | ICD-10-CM | POA: Diagnosis not present

## 2016-04-21 DIAGNOSIS — R937 Abnormal findings on diagnostic imaging of other parts of musculoskeletal system: Secondary | ICD-10-CM | POA: Diagnosis not present

## 2016-05-05 DIAGNOSIS — M79661 Pain in right lower leg: Secondary | ICD-10-CM

## 2016-05-05 DIAGNOSIS — M79662 Pain in left lower leg: Secondary | ICD-10-CM

## 2016-05-05 HISTORY — DX: Pain in right lower leg: M79.661

## 2016-05-05 HISTORY — DX: Pain in right lower leg: M79.662

## 2016-05-13 DIAGNOSIS — R2689 Other abnormalities of gait and mobility: Secondary | ICD-10-CM | POA: Diagnosis not present

## 2016-05-13 DIAGNOSIS — M6281 Muscle weakness (generalized): Secondary | ICD-10-CM | POA: Diagnosis not present

## 2016-05-13 DIAGNOSIS — M25561 Pain in right knee: Secondary | ICD-10-CM | POA: Diagnosis not present

## 2016-05-15 DIAGNOSIS — H353222 Exudative age-related macular degeneration, left eye, with inactive choroidal neovascularization: Secondary | ICD-10-CM | POA: Diagnosis not present

## 2016-05-16 DIAGNOSIS — M25561 Pain in right knee: Secondary | ICD-10-CM | POA: Diagnosis not present

## 2016-05-16 DIAGNOSIS — M6281 Muscle weakness (generalized): Secondary | ICD-10-CM | POA: Diagnosis not present

## 2016-05-16 DIAGNOSIS — R2689 Other abnormalities of gait and mobility: Secondary | ICD-10-CM | POA: Diagnosis not present

## 2016-05-20 DIAGNOSIS — M6281 Muscle weakness (generalized): Secondary | ICD-10-CM | POA: Diagnosis not present

## 2016-05-20 DIAGNOSIS — R2689 Other abnormalities of gait and mobility: Secondary | ICD-10-CM | POA: Diagnosis not present

## 2016-05-20 DIAGNOSIS — M25561 Pain in right knee: Secondary | ICD-10-CM | POA: Diagnosis not present

## 2016-05-22 DIAGNOSIS — M6281 Muscle weakness (generalized): Secondary | ICD-10-CM | POA: Diagnosis not present

## 2016-05-22 DIAGNOSIS — M25561 Pain in right knee: Secondary | ICD-10-CM | POA: Diagnosis not present

## 2016-05-22 DIAGNOSIS — R2689 Other abnormalities of gait and mobility: Secondary | ICD-10-CM | POA: Diagnosis not present

## 2016-05-27 DIAGNOSIS — M6281 Muscle weakness (generalized): Secondary | ICD-10-CM | POA: Diagnosis not present

## 2016-05-27 DIAGNOSIS — M25561 Pain in right knee: Secondary | ICD-10-CM | POA: Diagnosis not present

## 2016-05-27 DIAGNOSIS — R2689 Other abnormalities of gait and mobility: Secondary | ICD-10-CM | POA: Diagnosis not present

## 2016-05-29 DIAGNOSIS — M25561 Pain in right knee: Secondary | ICD-10-CM | POA: Diagnosis not present

## 2016-05-29 DIAGNOSIS — M6281 Muscle weakness (generalized): Secondary | ICD-10-CM | POA: Diagnosis not present

## 2016-05-29 DIAGNOSIS — R2689 Other abnormalities of gait and mobility: Secondary | ICD-10-CM | POA: Diagnosis not present

## 2016-06-03 DIAGNOSIS — R2689 Other abnormalities of gait and mobility: Secondary | ICD-10-CM | POA: Diagnosis not present

## 2016-06-03 DIAGNOSIS — M25561 Pain in right knee: Secondary | ICD-10-CM | POA: Diagnosis not present

## 2016-06-03 DIAGNOSIS — M6281 Muscle weakness (generalized): Secondary | ICD-10-CM | POA: Diagnosis not present

## 2016-06-05 DIAGNOSIS — M6281 Muscle weakness (generalized): Secondary | ICD-10-CM | POA: Diagnosis not present

## 2016-06-05 DIAGNOSIS — R2689 Other abnormalities of gait and mobility: Secondary | ICD-10-CM | POA: Diagnosis not present

## 2016-06-05 DIAGNOSIS — M25561 Pain in right knee: Secondary | ICD-10-CM | POA: Diagnosis not present

## 2016-06-10 DIAGNOSIS — M25561 Pain in right knee: Secondary | ICD-10-CM | POA: Diagnosis not present

## 2016-06-10 DIAGNOSIS — M6281 Muscle weakness (generalized): Secondary | ICD-10-CM | POA: Diagnosis not present

## 2016-06-10 DIAGNOSIS — R2689 Other abnormalities of gait and mobility: Secondary | ICD-10-CM | POA: Diagnosis not present

## 2016-06-12 DIAGNOSIS — M25561 Pain in right knee: Secondary | ICD-10-CM | POA: Diagnosis not present

## 2016-06-12 DIAGNOSIS — R2689 Other abnormalities of gait and mobility: Secondary | ICD-10-CM | POA: Diagnosis not present

## 2016-06-12 DIAGNOSIS — M6281 Muscle weakness (generalized): Secondary | ICD-10-CM | POA: Diagnosis not present

## 2016-06-16 DIAGNOSIS — M25561 Pain in right knee: Secondary | ICD-10-CM | POA: Diagnosis not present

## 2016-06-16 DIAGNOSIS — R2689 Other abnormalities of gait and mobility: Secondary | ICD-10-CM | POA: Diagnosis not present

## 2016-06-16 DIAGNOSIS — M6281 Muscle weakness (generalized): Secondary | ICD-10-CM | POA: Diagnosis not present

## 2016-06-19 DIAGNOSIS — M6281 Muscle weakness (generalized): Secondary | ICD-10-CM | POA: Diagnosis not present

## 2016-06-19 DIAGNOSIS — M25561 Pain in right knee: Secondary | ICD-10-CM | POA: Diagnosis not present

## 2016-06-19 DIAGNOSIS — R2689 Other abnormalities of gait and mobility: Secondary | ICD-10-CM | POA: Diagnosis not present

## 2016-06-23 DIAGNOSIS — R2689 Other abnormalities of gait and mobility: Secondary | ICD-10-CM | POA: Diagnosis not present

## 2016-06-23 DIAGNOSIS — M25561 Pain in right knee: Secondary | ICD-10-CM | POA: Diagnosis not present

## 2016-06-23 DIAGNOSIS — M1711 Unilateral primary osteoarthritis, right knee: Secondary | ICD-10-CM | POA: Diagnosis not present

## 2016-06-23 DIAGNOSIS — M6281 Muscle weakness (generalized): Secondary | ICD-10-CM | POA: Diagnosis not present

## 2016-06-26 DIAGNOSIS — R2689 Other abnormalities of gait and mobility: Secondary | ICD-10-CM | POA: Diagnosis not present

## 2016-06-26 DIAGNOSIS — M6281 Muscle weakness (generalized): Secondary | ICD-10-CM | POA: Diagnosis not present

## 2016-06-26 DIAGNOSIS — M25561 Pain in right knee: Secondary | ICD-10-CM | POA: Diagnosis not present

## 2016-06-30 DIAGNOSIS — R2689 Other abnormalities of gait and mobility: Secondary | ICD-10-CM | POA: Diagnosis not present

## 2016-06-30 DIAGNOSIS — M6281 Muscle weakness (generalized): Secondary | ICD-10-CM | POA: Diagnosis not present

## 2016-06-30 DIAGNOSIS — M25561 Pain in right knee: Secondary | ICD-10-CM | POA: Diagnosis not present

## 2016-07-03 DIAGNOSIS — R2689 Other abnormalities of gait and mobility: Secondary | ICD-10-CM | POA: Diagnosis not present

## 2016-07-03 DIAGNOSIS — M6281 Muscle weakness (generalized): Secondary | ICD-10-CM | POA: Diagnosis not present

## 2016-07-03 DIAGNOSIS — M25561 Pain in right knee: Secondary | ICD-10-CM | POA: Diagnosis not present

## 2016-07-07 DIAGNOSIS — I482 Chronic atrial fibrillation: Secondary | ICD-10-CM | POA: Diagnosis not present

## 2016-07-07 DIAGNOSIS — M6281 Muscle weakness (generalized): Secondary | ICD-10-CM | POA: Diagnosis not present

## 2016-07-07 DIAGNOSIS — M170 Bilateral primary osteoarthritis of knee: Secondary | ICD-10-CM | POA: Diagnosis not present

## 2016-07-07 DIAGNOSIS — M25561 Pain in right knee: Secondary | ICD-10-CM | POA: Diagnosis not present

## 2016-07-07 DIAGNOSIS — R2689 Other abnormalities of gait and mobility: Secondary | ICD-10-CM | POA: Diagnosis not present

## 2016-07-07 DIAGNOSIS — I10 Essential (primary) hypertension: Secondary | ICD-10-CM | POA: Diagnosis not present

## 2016-07-24 DIAGNOSIS — I110 Hypertensive heart disease with heart failure: Secondary | ICD-10-CM | POA: Diagnosis not present

## 2016-07-24 DIAGNOSIS — Z7901 Long term (current) use of anticoagulants: Secondary | ICD-10-CM | POA: Diagnosis not present

## 2016-07-24 DIAGNOSIS — I481 Persistent atrial fibrillation: Secondary | ICD-10-CM | POA: Diagnosis not present

## 2016-07-25 DIAGNOSIS — Z7901 Long term (current) use of anticoagulants: Secondary | ICD-10-CM | POA: Diagnosis not present

## 2016-07-25 DIAGNOSIS — I481 Persistent atrial fibrillation: Secondary | ICD-10-CM | POA: Diagnosis not present

## 2016-07-30 DIAGNOSIS — I4891 Unspecified atrial fibrillation: Secondary | ICD-10-CM | POA: Diagnosis not present

## 2016-07-31 DIAGNOSIS — H353222 Exudative age-related macular degeneration, left eye, with inactive choroidal neovascularization: Secondary | ICD-10-CM | POA: Diagnosis not present

## 2016-08-06 DIAGNOSIS — I4891 Unspecified atrial fibrillation: Secondary | ICD-10-CM | POA: Diagnosis not present

## 2016-08-07 DIAGNOSIS — H6123 Impacted cerumen, bilateral: Secondary | ICD-10-CM | POA: Diagnosis not present

## 2016-08-07 DIAGNOSIS — H9190 Unspecified hearing loss, unspecified ear: Secondary | ICD-10-CM | POA: Diagnosis not present

## 2016-10-10 DIAGNOSIS — Z23 Encounter for immunization: Secondary | ICD-10-CM | POA: Diagnosis not present

## 2016-10-10 DIAGNOSIS — I10 Essential (primary) hypertension: Secondary | ICD-10-CM | POA: Diagnosis not present

## 2016-10-10 DIAGNOSIS — Z0000 Encounter for general adult medical examination without abnormal findings: Secondary | ICD-10-CM | POA: Diagnosis not present

## 2016-10-10 DIAGNOSIS — Z1389 Encounter for screening for other disorder: Secondary | ICD-10-CM | POA: Diagnosis not present

## 2016-10-10 DIAGNOSIS — I4891 Unspecified atrial fibrillation: Secondary | ICD-10-CM | POA: Diagnosis not present

## 2016-10-10 DIAGNOSIS — I2510 Atherosclerotic heart disease of native coronary artery without angina pectoris: Secondary | ICD-10-CM | POA: Diagnosis not present

## 2016-10-22 DIAGNOSIS — I4891 Unspecified atrial fibrillation: Secondary | ICD-10-CM | POA: Diagnosis not present

## 2016-11-11 DIAGNOSIS — B351 Tinea unguium: Secondary | ICD-10-CM | POA: Insufficient documentation

## 2016-11-11 DIAGNOSIS — H353221 Exudative age-related macular degeneration, left eye, with active choroidal neovascularization: Secondary | ICD-10-CM | POA: Diagnosis not present

## 2016-11-11 DIAGNOSIS — H353113 Nonexudative age-related macular degeneration, right eye, advanced atrophic without subfoveal involvement: Secondary | ICD-10-CM | POA: Diagnosis not present

## 2016-11-11 HISTORY — DX: Tinea unguium: B35.1

## 2016-11-13 DIAGNOSIS — Z86010 Personal history of colonic polyps: Secondary | ICD-10-CM | POA: Diagnosis not present

## 2016-11-28 DIAGNOSIS — J029 Acute pharyngitis, unspecified: Secondary | ICD-10-CM | POA: Diagnosis not present

## 2017-01-14 DIAGNOSIS — I4891 Unspecified atrial fibrillation: Secondary | ICD-10-CM | POA: Diagnosis not present

## 2017-01-14 DIAGNOSIS — R0981 Nasal congestion: Secondary | ICD-10-CM | POA: Diagnosis not present

## 2017-01-14 DIAGNOSIS — I10 Essential (primary) hypertension: Secondary | ICD-10-CM | POA: Diagnosis not present

## 2017-01-29 DIAGNOSIS — I481 Persistent atrial fibrillation: Secondary | ICD-10-CM | POA: Diagnosis not present

## 2017-01-29 DIAGNOSIS — I110 Hypertensive heart disease with heart failure: Secondary | ICD-10-CM | POA: Diagnosis not present

## 2017-01-29 DIAGNOSIS — Z7901 Long term (current) use of anticoagulants: Secondary | ICD-10-CM | POA: Diagnosis not present

## 2017-01-29 DIAGNOSIS — N183 Chronic kidney disease, stage 3 (moderate): Secondary | ICD-10-CM | POA: Diagnosis not present

## 2017-02-04 DIAGNOSIS — H353113 Nonexudative age-related macular degeneration, right eye, advanced atrophic without subfoveal involvement: Secondary | ICD-10-CM | POA: Diagnosis not present

## 2017-02-16 DIAGNOSIS — I482 Chronic atrial fibrillation: Secondary | ICD-10-CM | POA: Diagnosis not present

## 2017-03-09 DIAGNOSIS — Z96652 Presence of left artificial knee joint: Secondary | ICD-10-CM | POA: Diagnosis not present

## 2017-03-09 DIAGNOSIS — G8929 Other chronic pain: Secondary | ICD-10-CM | POA: Insufficient documentation

## 2017-03-09 DIAGNOSIS — M1711 Unilateral primary osteoarthritis, right knee: Secondary | ICD-10-CM | POA: Diagnosis not present

## 2017-03-09 DIAGNOSIS — M25562 Pain in left knee: Secondary | ICD-10-CM | POA: Diagnosis not present

## 2017-03-09 DIAGNOSIS — M25561 Pain in right knee: Secondary | ICD-10-CM

## 2017-03-09 HISTORY — DX: Other chronic pain: G89.29

## 2017-05-18 DIAGNOSIS — I482 Chronic atrial fibrillation: Secondary | ICD-10-CM | POA: Diagnosis not present

## 2017-05-18 DIAGNOSIS — Z9181 History of falling: Secondary | ICD-10-CM | POA: Diagnosis not present

## 2017-05-18 DIAGNOSIS — M25561 Pain in right knee: Secondary | ICD-10-CM | POA: Diagnosis not present

## 2017-05-18 DIAGNOSIS — I10 Essential (primary) hypertension: Secondary | ICD-10-CM | POA: Diagnosis not present

## 2017-05-18 DIAGNOSIS — S8991XA Unspecified injury of right lower leg, initial encounter: Secondary | ICD-10-CM

## 2017-05-18 DIAGNOSIS — M1711 Unilateral primary osteoarthritis, right knee: Secondary | ICD-10-CM | POA: Diagnosis not present

## 2017-05-18 HISTORY — DX: Unspecified injury of right lower leg, initial encounter: S89.91XA

## 2017-05-26 DIAGNOSIS — R079 Chest pain, unspecified: Secondary | ICD-10-CM | POA: Diagnosis not present

## 2017-05-26 DIAGNOSIS — N3000 Acute cystitis without hematuria: Secondary | ICD-10-CM | POA: Diagnosis not present

## 2017-05-26 DIAGNOSIS — R531 Weakness: Secondary | ICD-10-CM | POA: Diagnosis not present

## 2017-05-29 DIAGNOSIS — G629 Polyneuropathy, unspecified: Secondary | ICD-10-CM | POA: Diagnosis not present

## 2017-05-29 DIAGNOSIS — R531 Weakness: Secondary | ICD-10-CM | POA: Diagnosis not present

## 2017-05-29 DIAGNOSIS — R079 Chest pain, unspecified: Secondary | ICD-10-CM | POA: Diagnosis not present

## 2017-05-29 DIAGNOSIS — M792 Neuralgia and neuritis, unspecified: Secondary | ICD-10-CM | POA: Diagnosis not present

## 2017-06-02 DIAGNOSIS — S064X1A Epidural hemorrhage with loss of consciousness of 30 minutes or less, initial encounter: Secondary | ICD-10-CM | POA: Diagnosis not present

## 2017-06-02 DIAGNOSIS — S098XXA Other specified injuries of head, initial encounter: Secondary | ICD-10-CM | POA: Diagnosis not present

## 2017-06-02 DIAGNOSIS — S0990XA Unspecified injury of head, initial encounter: Secondary | ICD-10-CM | POA: Diagnosis not present

## 2017-06-02 DIAGNOSIS — S199XXA Unspecified injury of neck, initial encounter: Secondary | ICD-10-CM | POA: Diagnosis not present

## 2017-06-03 DIAGNOSIS — G629 Polyneuropathy, unspecified: Secondary | ICD-10-CM | POA: Diagnosis not present

## 2017-06-03 DIAGNOSIS — R269 Unspecified abnormalities of gait and mobility: Secondary | ICD-10-CM | POA: Diagnosis not present

## 2017-06-08 DIAGNOSIS — G8929 Other chronic pain: Secondary | ICD-10-CM | POA: Diagnosis not present

## 2017-06-08 DIAGNOSIS — Z96652 Presence of left artificial knee joint: Secondary | ICD-10-CM | POA: Diagnosis not present

## 2017-06-08 DIAGNOSIS — M170 Bilateral primary osteoarthritis of knee: Secondary | ICD-10-CM | POA: Diagnosis not present

## 2017-06-09 DIAGNOSIS — R0789 Other chest pain: Secondary | ICD-10-CM | POA: Diagnosis not present

## 2017-06-09 DIAGNOSIS — K591 Functional diarrhea: Secondary | ICD-10-CM | POA: Diagnosis not present

## 2017-06-09 DIAGNOSIS — N3000 Acute cystitis without hematuria: Secondary | ICD-10-CM | POA: Diagnosis not present

## 2017-06-10 DIAGNOSIS — K591 Functional diarrhea: Secondary | ICD-10-CM | POA: Diagnosis not present

## 2017-06-12 DIAGNOSIS — M6281 Muscle weakness (generalized): Secondary | ICD-10-CM | POA: Diagnosis not present

## 2017-06-12 DIAGNOSIS — R269 Unspecified abnormalities of gait and mobility: Secondary | ICD-10-CM | POA: Diagnosis not present

## 2017-06-19 DIAGNOSIS — R269 Unspecified abnormalities of gait and mobility: Secondary | ICD-10-CM | POA: Diagnosis not present

## 2017-06-19 DIAGNOSIS — M6281 Muscle weakness (generalized): Secondary | ICD-10-CM | POA: Diagnosis not present

## 2017-06-22 DIAGNOSIS — R269 Unspecified abnormalities of gait and mobility: Secondary | ICD-10-CM | POA: Diagnosis not present

## 2017-06-22 DIAGNOSIS — M6281 Muscle weakness (generalized): Secondary | ICD-10-CM | POA: Diagnosis not present

## 2017-06-24 DIAGNOSIS — R269 Unspecified abnormalities of gait and mobility: Secondary | ICD-10-CM | POA: Diagnosis not present

## 2017-06-24 DIAGNOSIS — M6281 Muscle weakness (generalized): Secondary | ICD-10-CM | POA: Diagnosis not present

## 2017-06-29 DIAGNOSIS — M6281 Muscle weakness (generalized): Secondary | ICD-10-CM | POA: Diagnosis not present

## 2017-06-29 DIAGNOSIS — R269 Unspecified abnormalities of gait and mobility: Secondary | ICD-10-CM | POA: Diagnosis not present

## 2017-06-30 DIAGNOSIS — B351 Tinea unguium: Secondary | ICD-10-CM | POA: Diagnosis not present

## 2017-07-01 ENCOUNTER — Ambulatory Visit: Payer: Self-pay | Admitting: Cardiology

## 2017-07-01 DIAGNOSIS — R269 Unspecified abnormalities of gait and mobility: Secondary | ICD-10-CM | POA: Diagnosis not present

## 2017-07-01 DIAGNOSIS — M6281 Muscle weakness (generalized): Secondary | ICD-10-CM | POA: Diagnosis not present

## 2017-07-01 DIAGNOSIS — I110 Hypertensive heart disease with heart failure: Secondary | ICD-10-CM | POA: Insufficient documentation

## 2017-07-01 HISTORY — DX: Hypertensive heart disease with heart failure: I11.0

## 2017-07-06 DIAGNOSIS — R269 Unspecified abnormalities of gait and mobility: Secondary | ICD-10-CM | POA: Diagnosis not present

## 2017-07-06 DIAGNOSIS — M6281 Muscle weakness (generalized): Secondary | ICD-10-CM | POA: Diagnosis not present

## 2017-07-08 DIAGNOSIS — R269 Unspecified abnormalities of gait and mobility: Secondary | ICD-10-CM | POA: Diagnosis not present

## 2017-07-08 DIAGNOSIS — M6281 Muscle weakness (generalized): Secondary | ICD-10-CM | POA: Diagnosis not present

## 2017-07-13 DIAGNOSIS — M6281 Muscle weakness (generalized): Secondary | ICD-10-CM | POA: Diagnosis not present

## 2017-07-13 DIAGNOSIS — R269 Unspecified abnormalities of gait and mobility: Secondary | ICD-10-CM | POA: Diagnosis not present

## 2017-07-15 DIAGNOSIS — M6281 Muscle weakness (generalized): Secondary | ICD-10-CM | POA: Diagnosis not present

## 2017-07-15 DIAGNOSIS — R269 Unspecified abnormalities of gait and mobility: Secondary | ICD-10-CM | POA: Diagnosis not present

## 2017-07-20 DIAGNOSIS — R269 Unspecified abnormalities of gait and mobility: Secondary | ICD-10-CM | POA: Diagnosis not present

## 2017-07-20 DIAGNOSIS — M6281 Muscle weakness (generalized): Secondary | ICD-10-CM | POA: Diagnosis not present

## 2017-07-24 DIAGNOSIS — M6281 Muscle weakness (generalized): Secondary | ICD-10-CM | POA: Diagnosis not present

## 2017-07-24 DIAGNOSIS — R269 Unspecified abnormalities of gait and mobility: Secondary | ICD-10-CM | POA: Diagnosis not present

## 2017-07-27 DIAGNOSIS — M6281 Muscle weakness (generalized): Secondary | ICD-10-CM | POA: Diagnosis not present

## 2017-07-27 DIAGNOSIS — R269 Unspecified abnormalities of gait and mobility: Secondary | ICD-10-CM | POA: Diagnosis not present

## 2017-07-28 ENCOUNTER — Encounter: Payer: Self-pay | Admitting: Cardiology

## 2017-07-28 ENCOUNTER — Ambulatory Visit (INDEPENDENT_AMBULATORY_CARE_PROVIDER_SITE_OTHER): Payer: Medicare Other | Admitting: Cardiology

## 2017-07-28 VITALS — BP 160/70 | HR 84 | Ht 62.0 in | Wt 158.0 lb

## 2017-07-28 DIAGNOSIS — I119 Hypertensive heart disease without heart failure: Secondary | ICD-10-CM | POA: Diagnosis not present

## 2017-07-28 DIAGNOSIS — I481 Persistent atrial fibrillation: Secondary | ICD-10-CM

## 2017-07-28 DIAGNOSIS — Z79899 Other long term (current) drug therapy: Secondary | ICD-10-CM

## 2017-07-28 DIAGNOSIS — I4819 Other persistent atrial fibrillation: Secondary | ICD-10-CM

## 2017-07-28 HISTORY — DX: Other long term (current) drug therapy: Z79.899

## 2017-07-28 MED ORDER — LOSARTAN POTASSIUM 50 MG PO TABS: 50.0000 mg | ORAL_TABLET | Freq: Every day | ORAL | 3 refills | Status: DC

## 2017-07-28 NOTE — Patient Instructions (Signed)
Medication Instructions:  Your physician has recommended you make the following change in your medication:  START Losartatn (Cozaar)  50mg  one tablet daily    Labwork: Your physician recommends that you return for lab work today: BMP and Dig level  Testing/Procedures: None  Follow-Up: Your physician wants you to follow-up in: 6 months.   You will receive a reminder letter in the mail two months in advance. If you don't receive a letter, please call our office to schedule the follow-up appointment.  Any Other Special Instructions Will Be Listed Below (If Applicable).     If you need a refill on your cardiac medications before your next appointment, please call your pharmacy.

## 2017-07-28 NOTE — Progress Notes (Signed)
Cardiology Office Note:    Date:  07/28/2017   ID:  FINA HEIZER, DOB January 07, 1925, MRN [id number]  PCP:  Townsend Roger, MD  Cardiologist:  Shirlee More, MD    Referring MD: No ref. provider found    ASSESSMENT:    1. Persistent atrial fibrillation ([place])   2. Hypertensive heart disease, unspecified whether heart failure present   3. High risk medication use    PLAN:    In order of problems listed above:  1. Stable rate controlled continue calcium channel blocker digitoxin and current anticoagulant reduced dose. 2. Blood pressure target resume and ARB continue her diuretic. Check BMP with digoxin therapy 3. Stable continue digoxin and check level and renal function. All   Next appointment: 6 months   Medication Adjustments/Labs and Tests Ordered: Current medicines are reviewed at length with the patient today.  Concerns regarding medicines are outlined above.  Orders Placed This Encounter  Procedures  . Digoxin level  . Basic metabolic panel   Meds ordered this encounter  Medications  . losartan (COZAAR) 50 MG tablet    Sig: Take 1 tablet (50 mg total) by mouth daily.    Dispense:  90 tablet    Refill:  3    Chief Complaint  Patient presents with  . Follow-up    routine flup appt   . Congestive Heart Failure  . Atrial Fibrillation    History of Present Illness:    Tracey Allen is a 83 y.o. female with a hx of CHF,permanent  Atrial Fibrillation, HTN and stage 3 CKD  last seen 6 months ago.Marland KitchenHer blood pressure is not controlled and she had recently discontinued valsartan after hearing warnings on the television she's had no chest pain palpitations syncope TIA but has had falls has been seen in physical therapy and uses a cane. She's had no bleeding complications or anticoagulant. She's having no GI symptoms to suggest digoxin toxicity Compliance with diet, lifestyle and medications: Requested from urgent care Past Medical History:  Diagnosis Date  .  Arthritis   . Chronic anticoagulation 11/12/2015  . Chronic pain of both knees 03/09/2017  . GERD (gastroesophageal reflux disease)   . Hypertension   . Injury of right knee 05/18/2017  . Onychomycosis due to dermatophyte 11/11/2016  . Pain in both lower legs 05/05/2016  . Persistent atrial fibrillation ([place]) 11/12/2015  . Sinus bradycardia 11/12/2015  . Stomach ulcer   . Urination, excessive at night     Past Surgical History:  Procedure Laterality Date  . CATARACT EXTRACTION     bilateral  . OVARIAN CYST REMOVAL    . TOTAL KNEE ARTHROPLASTY  12/08/2011   Procedure: TOTAL KNEE ARTHROPLASTY;  Surgeon: Rudean Haskell, MD;  Location: [facility];  Service: Orthopedics;  Laterality: Left;  Total Knee Arthroplasty Left     Current Medications: Current Meds  Medication Sig  . Ascorbic Acid (VITAMIN C PO) Take 1 tablet by mouth daily.    . Calcium-Magnesium-Vitamin D (CALCIUM 500 PO) Take 1,000 mg by mouth daily.  . Cholecalciferol (VITAMIN D PO) Take 1 tablet by mouth daily.  . Cyanocobalamin (B-12 PO) Take 1 tablet by mouth daily.    . digoxin (DIGOX) 0.125 MG tablet Take 0.125 mg by mouth as directed. Takes one tablet on Mon, Wed, and Friday  . DILT-XR 120 MG 24 hr capsule Take 120 mg by mouth daily.  . hydrochlorothiazide (HYDRODIURIL) 25 MG tablet Take 25 mg by mouth daily.  . Multiple  Vitamins-Minerals (EYE VITAMINS PO) Take 1 tablet by mouth daily.  . Omega-3 Fatty Acids (FISH OIL PO) Take 1 capsule by mouth daily.  [name] omeprazole (PRILOSEC) 20 MG capsule Take 20 mg by mouth 2 (two) times daily.    . Rivaroxaban (XARELTO) 15 MG TABS tablet Take 15 mg by mouth every evening.     Allergies:   Amoxicillin; Codeine; Norvasc [amlodipine besylate]; and Penicillins   Social History   Social History  . Marital status: Divorced    Spouse name: N/A  . Number of children: N/A  . Years of education: N/A   Social History Main Topics  . Smoking status: Former Smoker    Types: Cigarettes    . Smokeless tobacco: Never Used  . Alcohol use No  . Drug use: No  . Sexual activity: Not Asked   Other Topics Concern  . None   Social History Narrative  . None     Family History: The patient's family history is not on file. ROS:   Please see the history of present illness.    All other systems reviewed and are negative.  EKGs/Labs/Other Studies Reviewed:    The following studies were reviewed today:    Recent Labs: No results found for requested labs within last 8760 hours.  Recent Lipid Panel No results found for: CHOL, TRIG, HDL, CHOLHDL, VLDL, LDLCALC, LDLDIRECT  Physical Exam:    VS:  BP (!) 160/70 (BP Location: Right Leg, Patient Position: Sitting, Cuff Size: Normal)   Pulse 84   Ht 5\' 2"  (1.575 m)   Wt 158 lb (71.7 kg)   SpO2 97%   BMI 28.90 kg/m     Wt Readings from Last 3 Encounters:  07/28/17 158 lb (71.7 kg)  12/01/11 171 lb 4.8 oz (77.7 kg)    She appears younger than her age, well developed in no acute distress HEENT: Normal NECK: No JVD; No carotid bruits LYMPHATICS: No lymphadenopathy CARDIAC: Irregular irregular variable first heart sound , no murmurs, rubs, gallops RESPIRATORY:  Clear to auscultation without rales, wheezing or rhonchi  ABDOMEN: Soft, non-tender, non-distended MUSCULOSKELETAL:  No edema; No deformity  SKIN: Warm and dry NEUROLOGIC:  Alert and oriented x 3 PSYCHIATRIC:  Normal affect    Signed, Shirlee More, MD  07/28/2017 5:02 PM    [facility]

## 2017-07-29 DIAGNOSIS — I481 Persistent atrial fibrillation: Secondary | ICD-10-CM | POA: Diagnosis not present

## 2017-07-29 DIAGNOSIS — Z79899 Other long term (current) drug therapy: Secondary | ICD-10-CM | POA: Diagnosis not present

## 2017-07-29 DIAGNOSIS — I119 Hypertensive heart disease without heart failure: Secondary | ICD-10-CM | POA: Diagnosis not present

## 2017-07-30 DIAGNOSIS — R269 Unspecified abnormalities of gait and mobility: Secondary | ICD-10-CM | POA: Diagnosis not present

## 2017-07-30 DIAGNOSIS — M6281 Muscle weakness (generalized): Secondary | ICD-10-CM | POA: Diagnosis not present

## 2017-07-30 LAB — BASIC METABOLIC PANEL WITH GFR
BUN/Creatinine Ratio: 17 (ref 12–28)
BUN: 19 mg/dL (ref 8–27)
CO2: 23 mmol/L (ref 20–29)
Calcium: 9.4 mg/dL (ref 8.7–10.3)
Chloride: 103 mmol/L (ref 96–106)
Creatinine, Ser: 1.09 mg/dL — ABNORMAL HIGH (ref 0.57–1.00)
GFR calc Af Amer: 52 mL/min/{1.73_m2} — ABNORMAL LOW
GFR calc non Af Amer: 45 mL/min/{1.73_m2} — ABNORMAL LOW
Glucose: 101 mg/dL — ABNORMAL HIGH (ref 65–99)
Potassium: 3.7 mmol/L (ref 3.5–5.2)
Sodium: 141 mmol/L (ref 134–144)

## 2017-07-30 LAB — DIGOXIN LEVEL: Digoxin, Serum: 0.5 ng/mL (ref 0.5–0.9)

## 2017-08-04 DIAGNOSIS — H353113 Nonexudative age-related macular degeneration, right eye, advanced atrophic without subfoveal involvement: Secondary | ICD-10-CM | POA: Diagnosis not present

## 2017-08-04 DIAGNOSIS — H353222 Exudative age-related macular degeneration, left eye, with inactive choroidal neovascularization: Secondary | ICD-10-CM | POA: Diagnosis not present

## 2017-08-04 DIAGNOSIS — H26493 Other secondary cataract, bilateral: Secondary | ICD-10-CM | POA: Diagnosis not present

## 2017-08-05 DIAGNOSIS — M6281 Muscle weakness (generalized): Secondary | ICD-10-CM | POA: Diagnosis not present

## 2017-08-05 DIAGNOSIS — R269 Unspecified abnormalities of gait and mobility: Secondary | ICD-10-CM | POA: Diagnosis not present

## 2017-08-06 DIAGNOSIS — I4891 Unspecified atrial fibrillation: Secondary | ICD-10-CM | POA: Diagnosis not present

## 2017-08-06 DIAGNOSIS — R296 Repeated falls: Secondary | ICD-10-CM | POA: Diagnosis not present

## 2017-08-06 DIAGNOSIS — I10 Essential (primary) hypertension: Secondary | ICD-10-CM | POA: Diagnosis not present

## 2017-08-06 DIAGNOSIS — K219 Gastro-esophageal reflux disease without esophagitis: Secondary | ICD-10-CM | POA: Diagnosis not present

## 2017-08-07 DIAGNOSIS — M6281 Muscle weakness (generalized): Secondary | ICD-10-CM | POA: Diagnosis not present

## 2017-08-07 DIAGNOSIS — R269 Unspecified abnormalities of gait and mobility: Secondary | ICD-10-CM | POA: Diagnosis not present

## 2017-08-10 DIAGNOSIS — M6281 Muscle weakness (generalized): Secondary | ICD-10-CM | POA: Diagnosis not present

## 2017-08-10 DIAGNOSIS — R269 Unspecified abnormalities of gait and mobility: Secondary | ICD-10-CM | POA: Diagnosis not present

## 2017-08-12 DIAGNOSIS — M6281 Muscle weakness (generalized): Secondary | ICD-10-CM | POA: Diagnosis not present

## 2017-08-12 DIAGNOSIS — R269 Unspecified abnormalities of gait and mobility: Secondary | ICD-10-CM | POA: Diagnosis not present

## 2017-08-13 DIAGNOSIS — H353221 Exudative age-related macular degeneration, left eye, with active choroidal neovascularization: Secondary | ICD-10-CM | POA: Diagnosis not present

## 2017-09-10 DIAGNOSIS — M25561 Pain in right knee: Secondary | ICD-10-CM | POA: Diagnosis not present

## 2017-09-10 DIAGNOSIS — M25562 Pain in left knee: Secondary | ICD-10-CM | POA: Diagnosis not present

## 2017-09-10 DIAGNOSIS — G8929 Other chronic pain: Secondary | ICD-10-CM | POA: Diagnosis not present

## 2017-09-24 DIAGNOSIS — H353221 Exudative age-related macular degeneration, left eye, with active choroidal neovascularization: Secondary | ICD-10-CM | POA: Diagnosis not present

## 2017-10-06 DIAGNOSIS — I10 Essential (primary) hypertension: Secondary | ICD-10-CM | POA: Diagnosis not present

## 2017-10-06 DIAGNOSIS — G629 Polyneuropathy, unspecified: Secondary | ICD-10-CM | POA: Diagnosis not present

## 2017-10-06 DIAGNOSIS — Z23 Encounter for immunization: Secondary | ICD-10-CM | POA: Diagnosis not present

## 2017-10-06 DIAGNOSIS — I4891 Unspecified atrial fibrillation: Secondary | ICD-10-CM | POA: Diagnosis not present

## 2017-10-29 DIAGNOSIS — H353221 Exudative age-related macular degeneration, left eye, with active choroidal neovascularization: Secondary | ICD-10-CM | POA: Diagnosis not present

## 2017-11-03 DIAGNOSIS — B351 Tinea unguium: Secondary | ICD-10-CM | POA: Diagnosis not present

## 2017-11-13 DIAGNOSIS — Z0000 Encounter for general adult medical examination without abnormal findings: Secondary | ICD-10-CM | POA: Diagnosis not present

## 2017-11-13 DIAGNOSIS — I10 Essential (primary) hypertension: Secondary | ICD-10-CM | POA: Diagnosis not present

## 2017-11-13 DIAGNOSIS — K219 Gastro-esophageal reflux disease without esophagitis: Secondary | ICD-10-CM | POA: Diagnosis not present

## 2017-11-13 DIAGNOSIS — I519 Heart disease, unspecified: Secondary | ICD-10-CM | POA: Diagnosis not present

## 2017-11-13 DIAGNOSIS — N183 Chronic kidney disease, stage 3 (moderate): Secondary | ICD-10-CM | POA: Diagnosis not present

## 2017-11-16 DIAGNOSIS — Z1212 Encounter for screening for malignant neoplasm of rectum: Secondary | ICD-10-CM | POA: Diagnosis not present

## 2017-11-16 DIAGNOSIS — E785 Hyperlipidemia, unspecified: Secondary | ICD-10-CM | POA: Diagnosis not present

## 2017-11-16 DIAGNOSIS — N183 Chronic kidney disease, stage 3 (moderate): Secondary | ICD-10-CM | POA: Diagnosis not present

## 2017-11-16 DIAGNOSIS — I10 Essential (primary) hypertension: Secondary | ICD-10-CM | POA: Diagnosis not present

## 2017-12-17 DIAGNOSIS — M1711 Unilateral primary osteoarthritis, right knee: Secondary | ICD-10-CM | POA: Diagnosis not present

## 2017-12-17 DIAGNOSIS — M25562 Pain in left knee: Secondary | ICD-10-CM | POA: Diagnosis not present

## 2017-12-17 DIAGNOSIS — G8929 Other chronic pain: Secondary | ICD-10-CM | POA: Diagnosis not present

## 2017-12-17 DIAGNOSIS — Z96652 Presence of left artificial knee joint: Secondary | ICD-10-CM | POA: Diagnosis not present

## 2017-12-17 DIAGNOSIS — H353221 Exudative age-related macular degeneration, left eye, with active choroidal neovascularization: Secondary | ICD-10-CM | POA: Diagnosis not present

## 2018-01-14 DIAGNOSIS — I4891 Unspecified atrial fibrillation: Secondary | ICD-10-CM | POA: Diagnosis not present

## 2018-01-14 DIAGNOSIS — I10 Essential (primary) hypertension: Secondary | ICD-10-CM | POA: Diagnosis not present

## 2018-01-21 DIAGNOSIS — H353221 Exudative age-related macular degeneration, left eye, with active choroidal neovascularization: Secondary | ICD-10-CM | POA: Diagnosis not present

## 2018-01-26 ENCOUNTER — Ambulatory Visit: Payer: Medicare Other | Admitting: Cardiology
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
## 2018-02-11 ENCOUNTER — Ambulatory Visit: Payer: Medicare Other | Admitting: Cardiology

## 2018-02-11 ENCOUNTER — Encounter: Payer: Self-pay | Admitting: Cardiology

## 2018-02-11 VITALS — BP 150/82 | HR 76 | Ht 62.0 in | Wt 157.0 lb

## 2018-02-11 DIAGNOSIS — Z7901 Long term (current) use of anticoagulants: Secondary | ICD-10-CM

## 2018-02-11 DIAGNOSIS — I481 Persistent atrial fibrillation: Secondary | ICD-10-CM | POA: Diagnosis not present

## 2018-02-11 DIAGNOSIS — I110 Hypertensive heart disease with heart failure: Secondary | ICD-10-CM

## 2018-02-11 DIAGNOSIS — I4819 Other persistent atrial fibrillation: Secondary | ICD-10-CM

## 2018-02-11 NOTE — Patient Instructions (Signed)

## 2018-02-18 DIAGNOSIS — B351 Tinea unguium: Secondary | ICD-10-CM | POA: Diagnosis not present

## 2018-02-18 DIAGNOSIS — H353221 Exudative age-related macular degeneration, left eye, with active choroidal neovascularization: Secondary | ICD-10-CM | POA: Diagnosis not present

## 2018-03-25 DIAGNOSIS — Z96652 Presence of left artificial knee joint: Secondary | ICD-10-CM | POA: Diagnosis not present

## 2018-03-25 DIAGNOSIS — M25562 Pain in left knee: Secondary | ICD-10-CM | POA: Diagnosis not present

## 2018-03-25 DIAGNOSIS — M1711 Unilateral primary osteoarthritis, right knee: Secondary | ICD-10-CM | POA: Diagnosis not present

## 2018-03-25 DIAGNOSIS — G8929 Other chronic pain: Secondary | ICD-10-CM | POA: Diagnosis not present

## 2018-04-08 DIAGNOSIS — H353221 Exudative age-related macular degeneration, left eye, with active choroidal neovascularization: Secondary | ICD-10-CM | POA: Diagnosis not present

## 2018-04-13 DIAGNOSIS — I4891 Unspecified atrial fibrillation: Secondary | ICD-10-CM | POA: Diagnosis not present

## 2018-04-13 DIAGNOSIS — I509 Heart failure, unspecified: Secondary | ICD-10-CM | POA: Diagnosis not present

## 2018-04-13 DIAGNOSIS — I10 Essential (primary) hypertension: Secondary | ICD-10-CM | POA: Diagnosis not present

## 2018-04-13 DIAGNOSIS — E785 Hyperlipidemia, unspecified: Secondary | ICD-10-CM | POA: Diagnosis not present

## 2018-04-13 DIAGNOSIS — N183 Chronic kidney disease, stage 3 (moderate): Secondary | ICD-10-CM | POA: Diagnosis not present

## 2018-04-13 DIAGNOSIS — J310 Chronic rhinitis: Secondary | ICD-10-CM | POA: Diagnosis not present

## 2018-05-13 DIAGNOSIS — H353221 Exudative age-related macular degeneration, left eye, with active choroidal neovascularization: Secondary | ICD-10-CM | POA: Diagnosis not present

## 2018-06-03 DIAGNOSIS — B351 Tinea unguium: Secondary | ICD-10-CM | POA: Diagnosis not present

## 2018-06-10 DIAGNOSIS — H353221 Exudative age-related macular degeneration, left eye, with active choroidal neovascularization: Secondary | ICD-10-CM | POA: Diagnosis not present

## 2018-06-29 DIAGNOSIS — M25562 Pain in left knee: Secondary | ICD-10-CM | POA: Diagnosis not present

## 2018-06-29 DIAGNOSIS — Z96652 Presence of left artificial knee joint: Secondary | ICD-10-CM | POA: Diagnosis not present

## 2018-06-29 DIAGNOSIS — G8929 Other chronic pain: Secondary | ICD-10-CM | POA: Diagnosis not present

## 2018-06-29 DIAGNOSIS — M1711 Unilateral primary osteoarthritis, right knee: Secondary | ICD-10-CM | POA: Diagnosis not present

## 2018-07-15 DIAGNOSIS — H353221 Exudative age-related macular degeneration, left eye, with active choroidal neovascularization: Secondary | ICD-10-CM | POA: Diagnosis not present

## 2018-07-20 DIAGNOSIS — I5032 Chronic diastolic (congestive) heart failure: Secondary | ICD-10-CM | POA: Diagnosis not present

## 2018-07-20 DIAGNOSIS — M25561 Pain in right knee: Secondary | ICD-10-CM | POA: Diagnosis not present

## 2018-07-20 DIAGNOSIS — I482 Chronic atrial fibrillation: Secondary | ICD-10-CM | POA: Diagnosis not present

## 2018-07-20 DIAGNOSIS — G8929 Other chronic pain: Secondary | ICD-10-CM | POA: Diagnosis not present

## 2018-07-20 DIAGNOSIS — M25562 Pain in left knee: Secondary | ICD-10-CM | POA: Diagnosis not present

## 2018-07-27 DIAGNOSIS — I509 Heart failure, unspecified: Secondary | ICD-10-CM

## 2018-07-27 HISTORY — DX: Heart failure, unspecified: I50.9

## 2018-08-19 ENCOUNTER — Encounter: Payer: Self-pay | Admitting: Cardiology

## 2018-08-19 ENCOUNTER — Ambulatory Visit (INDEPENDENT_AMBULATORY_CARE_PROVIDER_SITE_OTHER): Payer: Medicare Other | Admitting: Cardiology

## 2018-08-19 VITALS — BP 160/60 | HR 68 | Ht 62.0 in | Wt 153.6 lb

## 2018-08-19 DIAGNOSIS — I482 Chronic atrial fibrillation, unspecified: Secondary | ICD-10-CM

## 2018-08-19 DIAGNOSIS — Z79899 Other long term (current) drug therapy: Secondary | ICD-10-CM

## 2018-08-19 DIAGNOSIS — I495 Sick sinus syndrome: Secondary | ICD-10-CM

## 2018-08-19 DIAGNOSIS — I110 Hypertensive heart disease with heart failure: Secondary | ICD-10-CM | POA: Diagnosis not present

## 2018-08-19 DIAGNOSIS — Z7901 Long term (current) use of anticoagulants: Secondary | ICD-10-CM | POA: Diagnosis not present

## 2018-08-19 HISTORY — DX: Sick sinus syndrome: I49.5

## 2018-08-19 NOTE — Progress Notes (Signed)
Cardiology Office Note:    Date:  08/19/2018   ID:  Tracey Allen, DOB 30-Sep-1925, MRN [id number]  PCP: Dr Iona Coach  Cardiologist:  Shirlee More, MD    Referring MD: Nona Dell, Corene Cornea, MD    ASSESSMENT:    1. Chronic atrial fibrillation ([place])   2. Chronic anticoagulation   3. High risk medication use   4. Hypertensive heart disease with heart failure ([place])    PLAN:    In order of problems listed above:  1. Stable rate controlled and asymptomatic prior to leaving the office on review of EKG being performed and will continue with current suppressant therapy with low-dose digoxin low-dose calcium channel blocker and avoid beta-blockers that produced syncope in the past.  She will continue her current anticoagulant 2. Stable continue anticoagulant 3. Continue digoxin I will remind her PCP to check a digoxin level every 6 months 4. Her blood pressure traditionally have been quite labile she is very anxious today tells me it typically runs less than 465 systolic I am not can adjust her medications on a single visit.   Next appointment: One year   Medication Adjustments/Labs and Tests Ordered: Current medicines are reviewed at length with the patient today.  Concerns regarding medicines are outlined above.  No orders of the defined types were placed in this encounter.  No orders of the defined types were placed in this encounter.   No chief complaint on file.   History of Present Illness:    Tracey Allen is a 84 y.o. female with a hx of CHF,permanent  Atrial Fibrillation, HTN and stage 3 CKD  last seen 07/26/17. Compliance with diet, lifestyle and medications: Yes  She has had no palpitations syncope or bleeding complication of her anticoagulant no shortness of breath or chest pain.  She is increasingly troubled by lower extremity pain and was intolerant of the medication?  Gabapentin.  She relates she is very anxious today in office Past Medical History:  Diagnosis Date    . Arthritis   . Chronic anticoagulation 11/12/2015  . Chronic pain of both knees 03/09/2017  . Congestive heart failure (CHF) ([place]) 07/27/2018  . GERD (gastroesophageal reflux disease)   . Hypertension   . Injury of right knee 05/18/2017  . Onychomycosis due to dermatophyte 11/11/2016  . Pain in both lower legs 05/05/2016  . Persistent atrial fibrillation ([place]) 11/12/2015  . Sinus bradycardia 11/12/2015  . Stomach ulcer   . Urination, excessive at night     Past Surgical History:  Procedure Laterality Date  . CATARACT EXTRACTION     bilateral  . OVARIAN CYST REMOVAL    . TOTAL KNEE ARTHROPLASTY  12/08/2011   Procedure: TOTAL KNEE ARTHROPLASTY;  Surgeon: Rudean Haskell, MD;  Location: [place];  Service: Orthopedics;  Laterality: Left;  Total Knee Arthroplasty Left     Current Medications: No outpatient medications have been marked as taking for the 08/19/18 encounter (Appointment) with Richardo Priest, MD.     Allergies:   Amoxicillin; Codeine; Norvasc [amlodipine besylate]; and Penicillins   Social History   Socioeconomic History  . Marital status: Divorced    Spouse name: Not on file  . Number of children: Not on file  . Years of education: Not on file  . Highest education level: Not on file  Occupational History  . Not on file  Social Needs  . Financial resource strain: Not on file  . Food insecurity:    Worry: Not on file  Inability: Not on file  . Transportation needs:    Medical: Not on file    Non-medical: Not on file  Tobacco Use  . Smoking status: Former Smoker    Types: Cigarettes  . Smokeless tobacco: Never Used  Substance and Sexual Activity  . Alcohol use: No  . Drug use: No  . Sexual activity: Not on file  Lifestyle  . Physical activity:    Days per week: Not on file    Minutes per session: Not on file  . Stress: Not on file  Relationships  . Social connections:    Talks on phone: Not on file    Gets together: Not on file    Attends religious  service: Not on file    Active member of club or organization: Not on file    Attends meetings of clubs or organizations: Not on file    Relationship status: Not on file  Other Topics Concern  . Not on file  Social History Narrative  . Not on file     Family History: The patient's family history includes Cervical cancer in her sister; Heart attack in her brother and father; Hypertension in her mother. ROS:   Please see the history of present illness.    All other systems reviewed and are negative.  EKGs/Labs/Other Studies Reviewed:    The following studies were reviewed today:  EKG:  EKG ordered today.  The ekg ordered today demonstrates AF controlled rate  Recent Labs:   04/13/2018 BMP shows a creatinine 1.12 GFR 43 cc potassium 4.3 cholesterol total 175 HDL 57 LDL 102   Physical Exam:    VS:  There were no vitals taken for this visit.    Wt Readings from Last 3 Encounters:  02/11/18 157 lb (71.2 kg)  07/28/17 158 lb (71.7 kg)  12/01/11 171 lb 4.8 oz (77.7 kg)     GEN: Anxious Well nourished, well developed in no acute distress HEENT: Normal NECK: No JVD; No carotid bruits LYMPHATICS: No lymphadenopathy CARDIAC: IrrIrr variable s1 RESPIRATORY:  Clear to auscultation without rales, wheezing or rhonchi  ABDOMEN: Soft, non-tender, non-distended MUSCULOSKELETAL:  No edema; No deformity  SKIN: Warm and dry NEUROLOGIC:  Alert and oriented x 3 PSYCHIATRIC:  Normal affect    Signed, Shirlee More, MD  08/19/2018 7:43 AM    [place]

## 2018-08-19 NOTE — Patient Instructions (Signed)
Medication Instructions:  Your physician recommends that you continue on your current medications as directed. Please refer to the Current Medication list given to you today.   Labwork: None  Testing/Procedures: You had an EKG today.   Follow-Up: Your physician wants you to follow-up in: 1 year. You will receive a reminder letter in the mail two months in advance. If you don't receive a letter, please call our office to schedule the follow-up appointment.   If you need a refill on your cardiac medications before your next appointment, please call your pharmacy.   Thank you for choosing [facility]! Catherine Lockhart, RN [phone]    

## 2018-08-26 DIAGNOSIS — H353221 Exudative age-related macular degeneration, left eye, with active choroidal neovascularization: Secondary | ICD-10-CM | POA: Diagnosis not present

## 2018-09-07 DIAGNOSIS — G629 Polyneuropathy, unspecified: Secondary | ICD-10-CM | POA: Diagnosis not present

## 2018-09-30 DIAGNOSIS — H353221 Exudative age-related macular degeneration, left eye, with active choroidal neovascularization: Secondary | ICD-10-CM | POA: Diagnosis not present

## 2018-10-05 DIAGNOSIS — M25561 Pain in right knee: Secondary | ICD-10-CM | POA: Diagnosis not present

## 2018-10-05 DIAGNOSIS — B351 Tinea unguium: Secondary | ICD-10-CM | POA: Diagnosis not present

## 2018-10-05 DIAGNOSIS — M1712 Unilateral primary osteoarthritis, left knee: Secondary | ICD-10-CM | POA: Diagnosis not present

## 2018-10-05 DIAGNOSIS — G8929 Other chronic pain: Secondary | ICD-10-CM | POA: Diagnosis not present

## 2018-10-11 DIAGNOSIS — G629 Polyneuropathy, unspecified: Secondary | ICD-10-CM | POA: Diagnosis not present

## 2018-10-28 DIAGNOSIS — H353113 Nonexudative age-related macular degeneration, right eye, advanced atrophic without subfoveal involvement: Secondary | ICD-10-CM | POA: Diagnosis not present

## 2018-10-28 DIAGNOSIS — H353221 Exudative age-related macular degeneration, left eye, with active choroidal neovascularization: Secondary | ICD-10-CM | POA: Diagnosis not present

## 2018-11-08 DIAGNOSIS — G629 Polyneuropathy, unspecified: Secondary | ICD-10-CM | POA: Diagnosis not present

## 2018-11-16 DIAGNOSIS — Z0000 Encounter for general adult medical examination without abnormal findings: Secondary | ICD-10-CM | POA: Diagnosis not present

## 2018-11-16 DIAGNOSIS — I5189 Other ill-defined heart diseases: Secondary | ICD-10-CM | POA: Diagnosis not present

## 2018-11-16 DIAGNOSIS — Z1389 Encounter for screening for other disorder: Secondary | ICD-10-CM | POA: Diagnosis not present

## 2018-11-16 DIAGNOSIS — Z23 Encounter for immunization: Secondary | ICD-10-CM | POA: Diagnosis not present

## 2018-11-16 DIAGNOSIS — I2510 Atherosclerotic heart disease of native coronary artery without angina pectoris: Secondary | ICD-10-CM | POA: Diagnosis not present

## 2018-11-16 DIAGNOSIS — N183 Chronic kidney disease, stage 3 (moderate): Secondary | ICD-10-CM | POA: Diagnosis not present

## 2018-11-16 DIAGNOSIS — E785 Hyperlipidemia, unspecified: Secondary | ICD-10-CM | POA: Diagnosis not present

## 2018-12-02 DIAGNOSIS — H353221 Exudative age-related macular degeneration, left eye, with active choroidal neovascularization: Secondary | ICD-10-CM | POA: Diagnosis not present

## 2018-12-30 DIAGNOSIS — H353221 Exudative age-related macular degeneration, left eye, with active choroidal neovascularization: Secondary | ICD-10-CM | POA: Diagnosis not present

## 2019-01-11 DIAGNOSIS — M25561 Pain in right knee: Secondary | ICD-10-CM | POA: Diagnosis not present

## 2019-01-11 DIAGNOSIS — G8929 Other chronic pain: Secondary | ICD-10-CM | POA: Diagnosis not present

## 2019-01-11 DIAGNOSIS — M25562 Pain in left knee: Secondary | ICD-10-CM | POA: Diagnosis not present

## 2019-01-20 DIAGNOSIS — B351 Tinea unguium: Secondary | ICD-10-CM | POA: Diagnosis not present

## 2019-01-26 DIAGNOSIS — L255 Unspecified contact dermatitis due to plants, except food: Secondary | ICD-10-CM | POA: Diagnosis not present

## 2019-01-27 DIAGNOSIS — H353221 Exudative age-related macular degeneration, left eye, with active choroidal neovascularization: Secondary | ICD-10-CM | POA: Diagnosis not present

## 2019-02-21 DIAGNOSIS — G629 Polyneuropathy, unspecified: Secondary | ICD-10-CM | POA: Diagnosis not present

## 2019-02-21 DIAGNOSIS — I482 Chronic atrial fibrillation, unspecified: Secondary | ICD-10-CM | POA: Diagnosis not present

## 2019-02-21 DIAGNOSIS — I10 Essential (primary) hypertension: Secondary | ICD-10-CM | POA: Diagnosis not present

## 2019-02-21 DIAGNOSIS — I509 Heart failure, unspecified: Secondary | ICD-10-CM | POA: Diagnosis not present

## 2019-02-21 DIAGNOSIS — Z7901 Long term (current) use of anticoagulants: Secondary | ICD-10-CM | POA: Diagnosis not present

## 2019-02-24 DIAGNOSIS — H353221 Exudative age-related macular degeneration, left eye, with active choroidal neovascularization: Secondary | ICD-10-CM | POA: Diagnosis not present

## 2019-04-14 DIAGNOSIS — G8929 Other chronic pain: Secondary | ICD-10-CM | POA: Diagnosis not present

## 2019-04-14 DIAGNOSIS — M25561 Pain in right knee: Secondary | ICD-10-CM | POA: Diagnosis not present

## 2019-04-14 DIAGNOSIS — M25562 Pain in left knee: Secondary | ICD-10-CM | POA: Diagnosis not present

## 2019-05-23 DIAGNOSIS — M25551 Pain in right hip: Secondary | ICD-10-CM | POA: Diagnosis not present

## 2019-05-26 DIAGNOSIS — M5431 Sciatica, right side: Secondary | ICD-10-CM | POA: Diagnosis not present

## 2019-05-30 DIAGNOSIS — R2689 Other abnormalities of gait and mobility: Secondary | ICD-10-CM | POA: Diagnosis not present

## 2019-05-30 DIAGNOSIS — M5431 Sciatica, right side: Secondary | ICD-10-CM | POA: Diagnosis not present

## 2019-05-30 DIAGNOSIS — M545 Low back pain: Secondary | ICD-10-CM | POA: Diagnosis not present

## 2019-05-30 DIAGNOSIS — M79604 Pain in right leg: Secondary | ICD-10-CM | POA: Diagnosis not present

## 2019-06-01 DIAGNOSIS — M79604 Pain in right leg: Secondary | ICD-10-CM | POA: Diagnosis not present

## 2019-06-01 DIAGNOSIS — M545 Low back pain: Secondary | ICD-10-CM | POA: Diagnosis not present

## 2019-06-01 DIAGNOSIS — M5431 Sciatica, right side: Secondary | ICD-10-CM | POA: Diagnosis not present

## 2019-06-01 DIAGNOSIS — R2689 Other abnormalities of gait and mobility: Secondary | ICD-10-CM | POA: Diagnosis not present

## 2019-06-02 DIAGNOSIS — B351 Tinea unguium: Secondary | ICD-10-CM | POA: Diagnosis not present

## 2019-06-06 DIAGNOSIS — M5431 Sciatica, right side: Secondary | ICD-10-CM | POA: Diagnosis not present

## 2019-06-06 DIAGNOSIS — R2689 Other abnormalities of gait and mobility: Secondary | ICD-10-CM | POA: Diagnosis not present

## 2019-06-06 DIAGNOSIS — M545 Low back pain: Secondary | ICD-10-CM | POA: Diagnosis not present

## 2019-06-06 DIAGNOSIS — M79604 Pain in right leg: Secondary | ICD-10-CM | POA: Diagnosis not present

## 2019-06-08 DIAGNOSIS — M79604 Pain in right leg: Secondary | ICD-10-CM | POA: Diagnosis not present

## 2019-06-08 DIAGNOSIS — M5431 Sciatica, right side: Secondary | ICD-10-CM | POA: Diagnosis not present

## 2019-06-08 DIAGNOSIS — R2689 Other abnormalities of gait and mobility: Secondary | ICD-10-CM | POA: Diagnosis not present

## 2019-06-08 DIAGNOSIS — M545 Low back pain: Secondary | ICD-10-CM | POA: Diagnosis not present

## 2019-06-13 DIAGNOSIS — M5431 Sciatica, right side: Secondary | ICD-10-CM | POA: Diagnosis not present

## 2019-06-13 DIAGNOSIS — M545 Low back pain: Secondary | ICD-10-CM | POA: Diagnosis not present

## 2019-06-13 DIAGNOSIS — M79604 Pain in right leg: Secondary | ICD-10-CM | POA: Diagnosis not present

## 2019-06-13 DIAGNOSIS — R2689 Other abnormalities of gait and mobility: Secondary | ICD-10-CM | POA: Diagnosis not present

## 2019-06-15 DIAGNOSIS — M79604 Pain in right leg: Secondary | ICD-10-CM | POA: Diagnosis not present

## 2019-06-15 DIAGNOSIS — M545 Low back pain: Secondary | ICD-10-CM | POA: Diagnosis not present

## 2019-06-15 DIAGNOSIS — R2689 Other abnormalities of gait and mobility: Secondary | ICD-10-CM | POA: Diagnosis not present

## 2019-06-15 DIAGNOSIS — M5431 Sciatica, right side: Secondary | ICD-10-CM | POA: Diagnosis not present

## 2019-06-20 DIAGNOSIS — M545 Low back pain: Secondary | ICD-10-CM | POA: Diagnosis not present

## 2019-06-20 DIAGNOSIS — R2689 Other abnormalities of gait and mobility: Secondary | ICD-10-CM | POA: Diagnosis not present

## 2019-06-20 DIAGNOSIS — M79604 Pain in right leg: Secondary | ICD-10-CM | POA: Diagnosis not present

## 2019-06-20 DIAGNOSIS — M5431 Sciatica, right side: Secondary | ICD-10-CM | POA: Diagnosis not present

## 2019-06-22 DIAGNOSIS — M545 Low back pain: Secondary | ICD-10-CM | POA: Diagnosis not present

## 2019-06-22 DIAGNOSIS — M5431 Sciatica, right side: Secondary | ICD-10-CM | POA: Diagnosis not present

## 2019-06-22 DIAGNOSIS — R2689 Other abnormalities of gait and mobility: Secondary | ICD-10-CM | POA: Diagnosis not present

## 2019-06-22 DIAGNOSIS — M79604 Pain in right leg: Secondary | ICD-10-CM | POA: Diagnosis not present

## 2019-06-29 DIAGNOSIS — I10 Essential (primary) hypertension: Secondary | ICD-10-CM | POA: Diagnosis not present

## 2019-06-29 DIAGNOSIS — M545 Low back pain: Secondary | ICD-10-CM | POA: Diagnosis not present

## 2019-07-01 ENCOUNTER — Encounter: Payer: Self-pay | Admitting: Cardiology

## 2019-07-01 ENCOUNTER — Ambulatory Visit (INDEPENDENT_AMBULATORY_CARE_PROVIDER_SITE_OTHER): Payer: Medicare Other | Admitting: Cardiology

## 2019-07-01 ENCOUNTER — Other Ambulatory Visit: Payer: Self-pay

## 2019-07-01 VITALS — BP 138/82 | HR 82 | Temp 99.7°F | Ht 62.0 in | Wt 145.6 lb

## 2019-07-01 DIAGNOSIS — I110 Hypertensive heart disease with heart failure: Secondary | ICD-10-CM

## 2019-07-01 DIAGNOSIS — I482 Chronic atrial fibrillation, unspecified: Secondary | ICD-10-CM

## 2019-07-01 DIAGNOSIS — Z7901 Long term (current) use of anticoagulants: Secondary | ICD-10-CM | POA: Diagnosis not present

## 2019-07-01 DIAGNOSIS — Z79899 Other long term (current) drug therapy: Secondary | ICD-10-CM | POA: Diagnosis not present

## 2019-07-01 MED ORDER — OMEPRAZOLE 20 MG PO CPDR: 20.0000 mg | DELAYED_RELEASE_CAPSULE | Freq: Two times a day (BID) | ORAL | 1 refills | Status: DC

## 2019-07-01 MED ORDER — DILT-XR 120 MG PO CP24: 120.0000 mg | ORAL_CAPSULE | Freq: Every day | ORAL | 1 refills | Status: DC

## 2019-07-01 MED ORDER — RIVAROXABAN 15 MG PO TABS: 15.0000 mg | ORAL_TABLET | Freq: Every evening | ORAL | 1 refills | Status: DC

## 2019-07-01 MED ORDER — OLMESARTAN MEDOXOMIL 20 MG PO TABS: 20.0000 mg | ORAL_TABLET | Freq: Every day | ORAL | 1 refills | Status: DC

## 2019-07-01 MED ORDER — HYDROCHLOROTHIAZIDE 25 MG PO TABS: 25.0000 mg | ORAL_TABLET | Freq: Every day | ORAL | 1 refills | Status: DC

## 2019-07-01 MED ORDER — DIGOXIN 125 MCG PO TABS: 0.1250 mg | ORAL_TABLET | ORAL | 1 refills | Status: DC

## 2019-07-01 NOTE — Progress Notes (Signed)
Cardiology Office Note:    Date:  07/01/2019   ID:  Tracey Allen, DOB 1925/10/05, MRN [id number]  PCP:  Townsend Roger, MD  Cardiologist:  Shirlee More, MD    Referring MD: Townsend Roger, MD    ASSESSMENT:    1. Chronic atrial fibrillation   2. Chronic anticoagulation   3. High risk medication use   4. Hypertensive heart disease with heart failure ([place])    PLAN:    In order of problems listed above:  1. Chronic atrial fibrillation - Digoxin 0.125 three times per week.  Continue rate control digoxin calcium channel blocker and reduced dose anticoagulant 2. Chronic anticoagulation - Secondary to atrial fibrillation. Xarelto 15 mg every evening per PCP.  Continue anticoagulant 3. High risk medication use - Digoxin. Digoxin level 11/2018 0.4 at her PCP. Will ask PCP to continue to check every 6 months.  Recheck level 4. Hypertensive heart disease with heart failure -stable at this time not on a loop diuretic no evidence of decompensated heart failure blood pressures at target including Benicar   Next appointment: \6 months  Medication Adjustments/Labs and Tests Ordered: Current medicines are reviewed at length with the patient today.  Concerns regarding medicines are outlined above.  No orders of the defined types were placed in this encounter.  No orders of the defined types were placed in this encounter.   No chief complaint on file. Chief Complaint: 85 yo female presents for annual follow up of CHF, permanent atrial fibrillation, HTN.   History of Present Illness:    Tracey Allen is a 85 y.o. female with a hx of CHF, permanent atrial fibrillation, HTN, CKD  last seen 08/19/18. She had a digoxin level at her PCP 11/2018 which was 0.4.   Compliance with diet, lifestyle and medications: Yes  She is not doing as well she is having increasing low back pain radiating down her legs and finds her self much less active and functional.  No GI symptoms of digoxin toxicity  anorexia or vomiting no edema shortness of breath palpitation chest pain or syncope.  Tolerates her anticoagulant without bleeding complication.  Last labs were performed 8 months ago will repeat today including digoxin level renal function CBC Past Medical History:  Diagnosis Date  . Arthritis   . Chronic anticoagulation 11/12/2015  . Chronic pain of both knees 03/09/2017  . Congestive heart failure (CHF) ([place]) 07/27/2018  . GERD (gastroesophageal reflux disease)   . Hypertension   . Injury of right knee 05/18/2017  . Neuropathy   . Onychomycosis due to dermatophyte 11/11/2016  . Pain in both lower legs 05/05/2016  . Persistent atrial fibrillation 11/12/2015  . Sinus bradycardia 11/12/2015  . Stomach ulcer   . Urination, excessive at night     Past Surgical History:  Procedure Laterality Date  . CATARACT EXTRACTION     bilateral  . OVARIAN CYST REMOVAL    . TOTAL KNEE ARTHROPLASTY  12/08/2011   Procedure: TOTAL KNEE ARTHROPLASTY;  Surgeon: Rudean Haskell, MD;  Location: [place];  Service: Orthopedics;  Laterality: Left;  Total Knee Arthroplasty Left     Current Medications: Current Meds  Medication Sig  . Calcium-Magnesium-Vitamin D (CALCIUM 500 PO) Take 1,000 mg by mouth daily.  . Cholecalciferol (VITAMIN D PO) Take 1 tablet by mouth daily.  . Cyanocobalamin (B-12 PO) Take 1 tablet by mouth daily.    . digoxin (DIGOX) 0.125 MG tablet Take 0.125 mg by mouth as directed. Takes  one tablet on Mon, Wed, and Friday  . DILT-XR 120 MG 24 hr capsule Take 120 mg by mouth daily.  . DULoxetine (CYMBALTA) 20 MG capsule Take 20 mg by mouth daily.   . hydrochlorothiazide (HYDRODIURIL) 25 MG tablet Take 25 mg by mouth daily.  [name] olmesartan (BENICAR) 20 MG tablet Take 20 mg by mouth daily.  [name] omeprazole (PRILOSEC) 20 MG capsule Take 20 mg by mouth 2 (two) times daily.    . Rivaroxaban (XARELTO) 15 MG TABS tablet Take 15 mg by mouth every evening.     Allergies:   Amoxicillin, Codeine, Norvasc  [amlodipine besylate], Other, and Penicillins   Social History   Socioeconomic History  . Marital status: Divorced    Spouse name: Not on file  . Number of children: Not on file  . Years of education: Not on file  . Highest education level: Not on file  Occupational History  . Not on file  Social Needs  . Financial resource strain: Not on file  . Food insecurity    Worry: Not on file    Inability: Not on file  . Transportation needs    Medical: Not on file    Non-medical: Not on file  Tobacco Use  . Smoking status: Former Smoker    Types: Cigarettes  . Smokeless tobacco: Never Used  Substance and Sexual Activity  . Alcohol use: No  . Drug use: No  . Sexual activity: Not on file  Lifestyle  . Physical activity    Days per week: Not on file    Minutes per session: Not on file  . Stress: Not on file  Relationships  . Social Herbalist on phone: Not on file    Gets together: Not on file    Attends religious service: Not on file    Active member of club or organization: Not on file    Attends meetings of clubs or organizations: Not on file    Relationship status: Not on file  Other Topics Concern  . Not on file  Social History Narrative  . Not on file     Family History: The patient's family history includes Cervical cancer in her sister; Heart attack in her brother and father; Hypertension in her mother. ROS:   Please see the history of present illness.    All other systems reviewed and are negative.  EKGs/Labs/Other Studies Reviewed:    The following studies were reviewed today:  EKG:  EKG ordered today and personally reviewed.  The ekg ordered today demonstrates   Recent Labs:  Dec 2019 via [facility]: A1c 5.4, ALT 12, AST 17, digoxin 0.4, GFR 43, creatinine 1.13, Hb 12.7 Recent Lipid Panel Dec 2019 via [facility] total 170, HDL 54, LDL 101, tri 74  Physical Exam:    VS:  BP 138/82   Pulse 82   Temp 99.7 F (37.6 C)   Ht 5\' 2"  (1.575 m)   Wt 145 lb 9.6  oz (66 kg)   SpO2 96%   BMI 26.63 kg/m     Wt Readings from Last 3 Encounters:  07/01/19 145 lb 9.6 oz (66 kg)  08/19/18 153 lb 9.6 oz (69.7 kg)  02/11/18 157 lb (71.2 kg)     GEN: Frail elderly woman well nourished, well developed in no acute distress HEENT: Normal NECK: No JVD; No carotid bruits LYMPHATICS: No lymphadenopathy CARDIAC: Irregular irregular variable first heart sound no murmurs, rubs, gallops RESPIRATORY:  Clear to auscultation without  rales, wheezing or rhonchi  ABDOMEN: Soft, non-tender, non-distended MUSCULOSKELETAL:  No edema; No deformity  SKIN: Warm and dry NEUROLOGIC:  Alert and oriented x 3 PSYCHIATRIC:  Normal affect    Signed, Shirlee More, MD  07/01/2019 2:52 PM    [facility]

## 2019-07-01 NOTE — Patient Instructions (Signed)
Medication Instructions:  Your physician recommends that you continue on your current medications as directed. Please refer to the Current Medication list given to you today.  If you need a refill on your cardiac medications before your next appointment, please call your pharmacy.   Lab work: Your physician recommends that you return for lab work in: TODAY CBC,BMP,Digoxin level  If you have labs (blood work) drawn today and your tests are completely normal, you will receive your results only by: [name] MyChart Message (if you have MyChart) OR . A paper copy in the mail If you have any lab test that is abnormal or we need to change your treatment, we will call you to review the results.  Testing/Procedures: None  Follow-Up: At [facility], you and your health needs are our priority.  As part of our continuing mission to provide you with exceptional heart care, we have created designated Provider Care Teams.  These Care Teams include your primary Cardiologist (physician) and Advanced Practice Providers (APPs -  Physician Assistants and Nurse Practitioners) who all work together to provide you with the care you need, when you need it. You will need a follow up appointment in 6 months.   Any Other Special Instructions Will Be Listed Below (If Applicable).

## 2019-07-02 LAB — CBC
Hematocrit: 38.5 % (ref 34.0–46.6)
Hemoglobin: 12.8 g/dL (ref 11.1–15.9)
MCH: 30.4 pg (ref 26.6–33.0)
MCHC: 33.2 g/dL (ref 31.5–35.7)
MCV: 91 fL (ref 79–97)
Platelets: 218 10*3/uL (ref 150–450)
RBC: 4.21 x10E6/uL (ref 3.77–5.28)
RDW: 12.6 % (ref 11.7–15.4)
WBC: 7.4 10*3/uL (ref 3.4–10.8)

## 2019-07-02 LAB — BASIC METABOLIC PANEL
BUN/Creatinine Ratio: 19 (ref 12–28)
BUN: 23 mg/dL (ref 10–36)
CO2: 25 mmol/L (ref 20–29)
Calcium: 9.1 mg/dL (ref 8.7–10.3)
Chloride: 105 mmol/L (ref 96–106)
Creatinine, Ser: 1.2 mg/dL — ABNORMAL HIGH (ref 0.57–1.00)
GFR calc Af Amer: 46 mL/min/{1.73_m2} — ABNORMAL LOW (ref >59)
GFR calc non Af Amer: 40 mL/min/{1.73_m2} — ABNORMAL LOW (ref >59)
Glucose: 95 mg/dL (ref 65–99)
Potassium: 4 mmol/L (ref 3.5–5.2)
Sodium: 147 mmol/L — ABNORMAL HIGH (ref 134–144)

## 2019-07-02 LAB — DIGOXIN LEVEL: Digoxin, Serum: 0.8 ng/mL (ref 0.5–0.9)

## 2019-07-04 ENCOUNTER — Encounter: Payer: Self-pay | Admitting: *Deleted

## 2019-07-04 DIAGNOSIS — I10 Essential (primary) hypertension: Secondary | ICD-10-CM | POA: Diagnosis not present

## 2019-07-04 DIAGNOSIS — M545 Low back pain: Secondary | ICD-10-CM | POA: Diagnosis not present

## 2019-07-06 DIAGNOSIS — M545 Low back pain: Secondary | ICD-10-CM | POA: Diagnosis not present

## 2019-07-06 DIAGNOSIS — I10 Essential (primary) hypertension: Secondary | ICD-10-CM | POA: Diagnosis not present

## 2019-07-07 DIAGNOSIS — H353221 Exudative age-related macular degeneration, left eye, with active choroidal neovascularization: Secondary | ICD-10-CM | POA: Diagnosis not present

## 2019-07-11 DIAGNOSIS — M545 Low back pain: Secondary | ICD-10-CM | POA: Diagnosis not present

## 2019-07-13 DIAGNOSIS — M545 Low back pain: Secondary | ICD-10-CM | POA: Diagnosis not present

## 2019-07-14 DIAGNOSIS — M79662 Pain in left lower leg: Secondary | ICD-10-CM | POA: Diagnosis not present

## 2019-07-14 DIAGNOSIS — M25562 Pain in left knee: Secondary | ICD-10-CM | POA: Diagnosis not present

## 2019-07-14 DIAGNOSIS — M25561 Pain in right knee: Secondary | ICD-10-CM | POA: Diagnosis not present

## 2019-07-14 DIAGNOSIS — M79661 Pain in right lower leg: Secondary | ICD-10-CM | POA: Diagnosis not present

## 2019-07-14 DIAGNOSIS — M545 Low back pain: Secondary | ICD-10-CM | POA: Diagnosis not present

## 2019-08-30 DIAGNOSIS — H6123 Impacted cerumen, bilateral: Secondary | ICD-10-CM | POA: Diagnosis not present

## 2019-08-30 DIAGNOSIS — M542 Cervicalgia: Secondary | ICD-10-CM | POA: Diagnosis not present

## 2019-08-30 DIAGNOSIS — I4891 Unspecified atrial fibrillation: Secondary | ICD-10-CM | POA: Diagnosis not present

## 2019-08-30 DIAGNOSIS — R42 Dizziness and giddiness: Secondary | ICD-10-CM | POA: Diagnosis not present

## 2019-08-30 DIAGNOSIS — I10 Essential (primary) hypertension: Secondary | ICD-10-CM | POA: Diagnosis not present

## 2019-08-31 DIAGNOSIS — M47812 Spondylosis without myelopathy or radiculopathy, cervical region: Secondary | ICD-10-CM | POA: Diagnosis not present

## 2019-08-31 DIAGNOSIS — H6120 Impacted cerumen, unspecified ear: Secondary | ICD-10-CM | POA: Diagnosis not present

## 2019-08-31 DIAGNOSIS — R5383 Other fatigue: Secondary | ICD-10-CM | POA: Diagnosis not present

## 2019-08-31 DIAGNOSIS — Z79899 Other long term (current) drug therapy: Secondary | ICD-10-CM | POA: Diagnosis not present

## 2019-08-31 DIAGNOSIS — R42 Dizziness and giddiness: Secondary | ICD-10-CM | POA: Diagnosis not present

## 2019-08-31 DIAGNOSIS — M542 Cervicalgia: Secondary | ICD-10-CM | POA: Diagnosis not present

## 2019-08-31 DIAGNOSIS — M4802 Spinal stenosis, cervical region: Secondary | ICD-10-CM | POA: Diagnosis not present

## 2019-09-01 DIAGNOSIS — H353221 Exudative age-related macular degeneration, left eye, with active choroidal neovascularization: Secondary | ICD-10-CM | POA: Diagnosis not present

## 2019-09-09 DIAGNOSIS — H6120 Impacted cerumen, unspecified ear: Secondary | ICD-10-CM | POA: Diagnosis not present

## 2019-09-26 ENCOUNTER — Other Ambulatory Visit: Payer: Self-pay | Admitting: Cardiology

## 2019-09-29 DIAGNOSIS — H353221 Exudative age-related macular degeneration, left eye, with active choroidal neovascularization: Secondary | ICD-10-CM | POA: Diagnosis not present

## 2019-10-04 DIAGNOSIS — M79674 Pain in right toe(s): Secondary | ICD-10-CM

## 2019-10-04 DIAGNOSIS — B351 Tinea unguium: Secondary | ICD-10-CM | POA: Diagnosis not present

## 2019-10-04 DIAGNOSIS — M79675 Pain in left toe(s): Secondary | ICD-10-CM | POA: Diagnosis not present

## 2019-10-04 HISTORY — DX: Tinea unguium: M79.674

## 2019-10-04 HISTORY — DX: Tinea unguium: B35.1

## 2019-10-12 DIAGNOSIS — G4700 Insomnia, unspecified: Secondary | ICD-10-CM | POA: Diagnosis not present

## 2019-10-12 DIAGNOSIS — I10 Essential (primary) hypertension: Secondary | ICD-10-CM | POA: Diagnosis not present

## 2019-10-12 DIAGNOSIS — K219 Gastro-esophageal reflux disease without esophagitis: Secondary | ICD-10-CM | POA: Diagnosis not present

## 2019-10-12 DIAGNOSIS — M8949 Other hypertrophic osteoarthropathy, multiple sites: Secondary | ICD-10-CM | POA: Diagnosis not present

## 2019-10-18 DIAGNOSIS — M25561 Pain in right knee: Secondary | ICD-10-CM | POA: Diagnosis not present

## 2019-10-18 DIAGNOSIS — G8929 Other chronic pain: Secondary | ICD-10-CM | POA: Diagnosis not present

## 2019-10-18 DIAGNOSIS — M25562 Pain in left knee: Secondary | ICD-10-CM | POA: Diagnosis not present

## 2019-10-31 DIAGNOSIS — R269 Unspecified abnormalities of gait and mobility: Secondary | ICD-10-CM | POA: Diagnosis not present

## 2019-10-31 DIAGNOSIS — H6121 Impacted cerumen, right ear: Secondary | ICD-10-CM | POA: Diagnosis not present

## 2019-10-31 DIAGNOSIS — H61303 Acquired stenosis of external ear canal, unspecified, bilateral: Secondary | ICD-10-CM | POA: Diagnosis not present

## 2019-11-01 DIAGNOSIS — I10 Essential (primary) hypertension: Secondary | ICD-10-CM | POA: Diagnosis not present

## 2019-11-01 DIAGNOSIS — I6523 Occlusion and stenosis of bilateral carotid arteries: Secondary | ICD-10-CM | POA: Diagnosis not present

## 2019-11-01 DIAGNOSIS — R42 Dizziness and giddiness: Secondary | ICD-10-CM | POA: Diagnosis not present

## 2019-11-17 DIAGNOSIS — H353221 Exudative age-related macular degeneration, left eye, with active choroidal neovascularization: Secondary | ICD-10-CM | POA: Diagnosis not present

## 2019-12-19 ENCOUNTER — Other Ambulatory Visit: Payer: Self-pay | Admitting: Cardiology

## 2019-12-28 DIAGNOSIS — Z79899 Other long term (current) drug therapy: Secondary | ICD-10-CM | POA: Diagnosis not present

## 2019-12-28 DIAGNOSIS — R42 Dizziness and giddiness: Secondary | ICD-10-CM | POA: Diagnosis not present

## 2019-12-28 DIAGNOSIS — R079 Chest pain, unspecified: Secondary | ICD-10-CM | POA: Diagnosis not present

## 2019-12-28 DIAGNOSIS — I4891 Unspecified atrial fibrillation: Secondary | ICD-10-CM | POA: Diagnosis not present

## 2019-12-28 DIAGNOSIS — I10 Essential (primary) hypertension: Secondary | ICD-10-CM | POA: Diagnosis not present

## 2020-01-05 NOTE — Progress Notes (Signed)
**Note Tracey-Identified via Obfuscation** Cardiology Office Note:    Date:  01/06/2020   ID:  Tracey Allen, DOB [birth year], MRN [id number]  PCP:  Townsend Roger, MD  Cardiologist:  Shirlee More, MD    Referring MD: Townsend Roger, MD    ASSESSMENT:    1. Chronic atrial fibrillation ([place])   2. Chronic anticoagulation   3. High risk medication use   4. Hypertensive heart disease with heart failure (Tracey Allen)   5. Frequent falls    PLAN:    In order of problems listed above:  1. Atrial fibrillation is rate controlled is difficult to tell if her episodes are fainting following her vertigo I think her best to stop digoxin she could be toxic and a 7-day ZIO monitor to assess for symptomatic bradycardia. 2. Continue reduced dose anticoagulant 3. With controlled atrial fibrillation working to discontinue digoxin narrow therapeutic toxic ratio 4. Hypertension is stable BP at target continue current medication 5. Her symptoms are difficult to define and add some extra falls vertigo and near syncope at risk for bradycardia 7-day ZIO monitor and reassess in the office 4 weeks   Next appointment: 4 weeks   Medication Adjustments/Labs and Tests Ordered: Current medicines are reviewed at length with the patient today.  Concerns regarding medicines are outlined above.  No orders of the defined types were placed in this encounter.  No orders of the defined types were placed in this encounter.   Chief Complaint  Patient presents with  . Follow-up    He is most concerned about frequent falls  . Atrial Fibrillation    On digoxin  . Anticoagulation    History of Present Illness:    Tracey Allen is a 86 y.o. female with a hx of CHF, permanent atrial fibrillation, HTN, CKD  last seen 07/01/2019. Compliance with diet, lifestyle and medications: Yes  She tells me that she is now seeing Dr.Prochnau and had full lab work in the last 1 to 2 weeks and is unaware of the results.  Unfortunately she is unsure of her medications but  tells me nothing is changed and confirms a she is taking digoxin and her reduced dose anticoagulant.  She tells me she has had frequent falls some of them are abrupt weakness where she goes to the ground and some are associated with head movement and vertigo.  She was placed on a medication?  Antivert and it made her very lethargic and sleepy and she discontinued.  Is not having symptoms of digitalis toxicity with visual changes nausea or vomiting and she is not having palpitation.  No edema shortness of breath or chest pain.  Laboratory studies 08/31/2019 show creatinine 1.12 and a normal TSH there are no recent labs available Past Medical History:  Diagnosis Date  . Arthritis   . Chronic anticoagulation 11/12/2015  . Chronic pain of both knees 03/09/2017  . Congestive heart failure (CHF) ([place]) 07/27/2018  . GERD (gastroesophageal reflux disease)   . Hypertension   . Injury of right knee 05/18/2017  . Neuropathy   . Onychomycosis due to dermatophyte 11/11/2016  . Pain in both lower legs 05/05/2016  . Persistent atrial fibrillation ([place]) 11/12/2015  . Sinus bradycardia 11/12/2015  . Stomach ulcer   . Urination, excessive at night     Past Surgical History:  Procedure Laterality Date  . CATARACT EXTRACTION     bilateral  . OVARIAN CYST REMOVAL    . TOTAL KNEE ARTHROPLASTY  12/08/2011   Procedure: TOTAL KNEE ARTHROPLASTY;  Surgeon: Rudean Haskell, MD;  Location: [facility];  Service: Orthopedics;  Laterality: Left;  Total Knee Arthroplasty Left     Current Medications: Current Meds  Medication Sig  . Calcium-Magnesium-Vitamin D (CALCIUM 500 PO) Take 1,000 mg by mouth daily.  . Cholecalciferol (VITAMIN D PO) Take 1 tablet by mouth daily.  . Cyanocobalamin (B-12 PO) Take 1 tablet by mouth daily.    . digoxin (DIGOX) 0.125 MG tablet Take 1 tablet (0.125 mg total) by mouth as directed. Takes one tablet on Mon, Wed, and Friday  . DILT-XR 120 MG 24 hr capsule TAKE ONE CAPSULE BY MOUTH DAILY  .  DULoxetine (CYMBALTA) 20 MG capsule Take 20 mg by mouth daily.   . hydrochlorothiazide (HYDRODIURIL) 25 MG tablet TAKE ONE TABLET BY MOUTH DAILY  . olmesartan (BENICAR) 20 MG tablet TAKE ONE TABLET BY MOUTH DAILY  . omeprazole (PRILOSEC) 20 MG capsule TAKE ONE CAPSULE BY MOUTH DAILY and TAKE ONE CAPSULE BY MOUTH DAILY  . Rivaroxaban (XARELTO) 15 MG TABS tablet Take 1 tablet (15 mg total) by mouth every evening.     Allergies:   Amoxicillin, Codeine, Norvasc [amlodipine besylate], Other, and Penicillins   Social History   Socioeconomic History  . Marital status: Divorced    Spouse name: Not on file  . Number of children: Not on file  . Years of education: Not on file  . Highest education level: Not on file  Occupational History  . Not on file  Tobacco Use  . Smoking status: Former Smoker    Types: Cigarettes  . Smokeless tobacco: Never Used  Substance and Sexual Activity  . Alcohol use: No  . Drug use: No  . Sexual activity: Not on file  Other Topics Concern  . Not on file  Social History Narrative  . Not on file   Social Determinants of Health   Financial Resource Strain:   . Difficulty of Paying Living Expenses: Not on file  Food Insecurity:   . Worried About Charity fundraiser in the Last Year: Not on file  . Ran Out of Food in the Last Year: Not on file  Transportation Needs:   . Lack of Transportation (Medical): Not on file  . Lack of Transportation (Non-Medical): Not on file  Physical Activity:   . Days of Exercise per Week: Not on file  . Minutes of Exercise per Session: Not on file  Stress:   . Feeling of Stress : Not on file  Social Connections:   . Frequency of Communication with Friends and Family: Not on file  . Frequency of Social Gatherings with Friends and Family: Not on file  . Attends Religious Services: Not on file  . Active Member of Clubs or Organizations: Not on file  . Attends Archivist Meetings: Not on file  . Marital Status: Not  on file     Family History: The patient's family history includes Cervical cancer in her sister; Heart attack in her brother and father; Hypertension in her mother. ROS:   Please see the history of present illness.    All other systems reviewed and are negative.  EKGs/Labs/Other Studies Reviewed:    The following studies were reviewed today:  EKG:  EKG ordered today and personally reviewed.  The ekg ordered today demonstrates rate controlled atrial fibrillation nonspecific ST digitalis effect  Recent Labs: 07/01/2019: BUN 23; Creatinine, Ser 1.20; Hemoglobin 12.8; Platelets 218; Potassium 4.0; Sodium 147  Recent Lipid Panel No results found  for: CHOL, TRIG, HDL, CHOLHDL, VLDL, LDLCALC, LDLDIRECT  Physical Exam:    VS:  BP 136/77   Pulse 77   Ht 5\' 2"  (1.575 m)   Wt 146 lb (66.2 kg)   SpO2 97%   BMI 26.70 kg/m     Wt Readings from Last 3 Encounters:  01/06/20 146 lb (66.2 kg)  07/01/19 145 lb 9.6 oz (66 kg)  08/19/18 153 lb 9.6 oz (69.7 kg)     GEN: For her age she looks well well nourished, well developed in no acute distress HEENT: Normal NECK: No JVD; No carotid bruits LYMPHATICS: No lymphadenopathy CARDIAC: Irregular S1 is variable no gallop murmur RESPIRATORY:  Clear to auscultation without rales, wheezing or rhonchi  ABDOMEN: Soft, non-tender, non-distended MUSCULOSKELETAL:  No edema; No deformity  SKIN: Warm and dry NEUROLOGIC:  Alert and oriented x 3 PSYCHIATRIC:  Normal affect    Signed, Shirlee More, MD  01/06/2020 11:29 AM    [facility]

## 2020-01-06 ENCOUNTER — Ambulatory Visit (INDEPENDENT_AMBULATORY_CARE_PROVIDER_SITE_OTHER): Payer: Medicare Other | Admitting: Cardiology

## 2020-01-06 ENCOUNTER — Other Ambulatory Visit: Payer: Self-pay

## 2020-01-06 ENCOUNTER — Encounter: Payer: Self-pay | Admitting: Cardiology

## 2020-01-06 ENCOUNTER — Ambulatory Visit (INDEPENDENT_AMBULATORY_CARE_PROVIDER_SITE_OTHER): Payer: Medicare Other

## 2020-01-06 VITALS — BP 136/77 | HR 77 | Ht 62.0 in | Wt 146.0 lb

## 2020-01-06 DIAGNOSIS — I110 Hypertensive heart disease with heart failure: Secondary | ICD-10-CM

## 2020-01-06 DIAGNOSIS — I482 Chronic atrial fibrillation, unspecified: Secondary | ICD-10-CM

## 2020-01-06 DIAGNOSIS — Z7901 Long term (current) use of anticoagulants: Secondary | ICD-10-CM | POA: Diagnosis not present

## 2020-01-06 DIAGNOSIS — R296 Repeated falls: Secondary | ICD-10-CM

## 2020-01-06 DIAGNOSIS — Z79899 Other long term (current) drug therapy: Secondary | ICD-10-CM

## 2020-01-06 NOTE — Patient Instructions (Signed)
Medication Instructions:  1) Discontinue Digoxin   *If you need a refill on your cardiac medications before your next appointment, please call your pharmacy*  Lab Work: Bmet, Cbc & Digoxin- Today   If you have labs (blood work) drawn today and your tests are completely normal, you will receive your results only by: [name] MyChart Message (if you have MyChart) OR . A paper copy in the mail If you have any lab test that is abnormal or we need to change your treatment, we will call you to review the results.  Testing/Procedures: A zio monitor was ordered today. It will remain on for 7 days. You will then return monitor and event diary in provided box. It takes 1-2 weeks for report to be downloaded and returned to [place]. We will call you with the results. If monitor falls off or has orange flashing light, please call Zio for further instructions.     Follow-Up: At [facility], you and your health needs are our priority.  As part of our continuing mission to provide you with exceptional heart care, we have created designated Provider Care Teams.  These Care Teams include your primary Cardiologist (physician) and Advanced Practice Providers (APPs -  Physician Assistants and Nurse Practitioners) who all work together to provide you with the care you need, when you need it.  Your next appointment:   4 week(s)  The format for your next appointment:   In Person  Provider:   Shirlee More, MD  Other Instructions None

## 2020-01-07 LAB — CBC
Hematocrit: 39.1 % (ref 34.0–46.6)
Hemoglobin: 13.2 g/dL (ref 11.1–15.9)
MCH: 30.2 pg (ref 26.6–33.0)
MCHC: 33.8 g/dL (ref 31.5–35.7)
MCV: 90 fL (ref 79–97)
Platelets: 258 10*3/uL (ref 150–450)
RBC: 4.37 x10E6/uL (ref 3.77–5.28)
RDW: 11.8 % (ref 11.7–15.4)
WBC: 6.6 10*3/uL (ref 3.4–10.8)

## 2020-01-07 LAB — BASIC METABOLIC PANEL
BUN/Creatinine Ratio: 12 (ref 12–28)
BUN: 16 mg/dL (ref 10–36)
CO2: 28 mmol/L (ref 20–29)
Calcium: 9.2 mg/dL (ref 8.7–10.3)
Chloride: 104 mmol/L (ref 96–106)
Creatinine, Ser: 1.38 mg/dL — ABNORMAL HIGH (ref 0.57–1.00)
GFR calc Af Amer: 38 mL/min/{1.73_m2} — ABNORMAL LOW (ref >59)
GFR calc non Af Amer: 33 mL/min/{1.73_m2} — ABNORMAL LOW (ref >59)
Glucose: 86 mg/dL (ref 65–99)
Potassium: 3.9 mmol/L (ref 3.5–5.2)
Sodium: 145 mmol/L — ABNORMAL HIGH (ref 134–144)

## 2020-01-07 LAB — DIGOXIN LEVEL: Digoxin, Serum: 0.6 ng/mL (ref 0.5–0.9)

## 2020-01-08 DIAGNOSIS — S0101XA Laceration without foreign body of scalp, initial encounter: Secondary | ICD-10-CM | POA: Diagnosis not present

## 2020-01-08 DIAGNOSIS — Z7902 Long term (current) use of antithrombotics/antiplatelets: Secondary | ICD-10-CM | POA: Diagnosis not present

## 2020-01-08 DIAGNOSIS — R42 Dizziness and giddiness: Secondary | ICD-10-CM | POA: Diagnosis not present

## 2020-01-08 DIAGNOSIS — S199XXA Unspecified injury of neck, initial encounter: Secondary | ICD-10-CM | POA: Diagnosis not present

## 2020-01-08 DIAGNOSIS — I10 Essential (primary) hypertension: Secondary | ICD-10-CM | POA: Diagnosis not present

## 2020-01-08 DIAGNOSIS — W19XXXA Unspecified fall, initial encounter: Secondary | ICD-10-CM | POA: Diagnosis not present

## 2020-01-08 DIAGNOSIS — S0181XA Laceration without foreign body of other part of head, initial encounter: Secondary | ICD-10-CM | POA: Diagnosis not present

## 2020-01-08 DIAGNOSIS — Z743 Need for continuous supervision: Secondary | ICD-10-CM | POA: Diagnosis not present

## 2020-01-11 ENCOUNTER — Telehealth: Payer: Self-pay | Admitting: Cardiology

## 2020-01-11 NOTE — Telephone Encounter (Signed)
I spoke to the patient's daughter and informed her that the patient was to stop Digoxin and wear a heart monitor, according to last OV.  She verbalized understanding and was thankful for the call.

## 2020-01-11 NOTE — Telephone Encounter (Signed)
Patient's daughter calling to find out what medication the patient is supposed to stop and for how long.

## 2020-01-16 ENCOUNTER — Other Ambulatory Visit: Payer: Self-pay | Admitting: Cardiology

## 2020-01-19 DIAGNOSIS — R42 Dizziness and giddiness: Secondary | ICD-10-CM | POA: Diagnosis not present

## 2020-01-19 DIAGNOSIS — R2681 Unsteadiness on feet: Secondary | ICD-10-CM | POA: Diagnosis not present

## 2020-01-19 DIAGNOSIS — H353221 Exudative age-related macular degeneration, left eye, with active choroidal neovascularization: Secondary | ICD-10-CM | POA: Diagnosis not present

## 2020-01-20 DIAGNOSIS — I10 Essential (primary) hypertension: Secondary | ICD-10-CM | POA: Diagnosis not present

## 2020-01-24 DIAGNOSIS — M25561 Pain in right knee: Secondary | ICD-10-CM | POA: Diagnosis not present

## 2020-01-24 DIAGNOSIS — R42 Dizziness and giddiness: Secondary | ICD-10-CM | POA: Diagnosis not present

## 2020-01-24 DIAGNOSIS — R2681 Unsteadiness on feet: Secondary | ICD-10-CM | POA: Diagnosis not present

## 2020-01-24 DIAGNOSIS — M25562 Pain in left knee: Secondary | ICD-10-CM | POA: Diagnosis not present

## 2020-02-07 ENCOUNTER — Telehealth: Payer: Self-pay | Admitting: *Deleted

## 2020-02-07 DIAGNOSIS — M79675 Pain in left toe(s): Secondary | ICD-10-CM | POA: Diagnosis not present

## 2020-02-07 DIAGNOSIS — B351 Tinea unguium: Secondary | ICD-10-CM | POA: Diagnosis not present

## 2020-02-07 DIAGNOSIS — M79674 Pain in right toe(s): Secondary | ICD-10-CM | POA: Diagnosis not present

## 2020-02-07 NOTE — Telephone Encounter (Signed)
Spoke with Butch Penny, pt's daughter about monitor which has never been mailed back. Pt states we didn't give her a box to mail monitor back in so spoke with daughter who said she would look for the box and let me know if she can find it. She will get back to me or I will call her if I don't hear back in a day. Dr. Bettina Gavia would like appt pushed back due to no results yet for monitor.

## 2020-02-08 ENCOUNTER — Other Ambulatory Visit: Payer: Self-pay | Admitting: Cardiology

## 2020-02-08 NOTE — Telephone Encounter (Signed)
Spoke with pt's daughter, Butch Penny, and rescheduled pt's appt for March. Butch Penny will bring by monitor today or tomorrow and we will mail it into irhythm in box we have here in office.

## 2020-02-10 ENCOUNTER — Ambulatory Visit: Payer: Medicare Other | Admitting: Cardiology

## 2020-02-10 DIAGNOSIS — I10 Essential (primary) hypertension: Secondary | ICD-10-CM | POA: Diagnosis not present

## 2020-02-10 DIAGNOSIS — M25512 Pain in left shoulder: Secondary | ICD-10-CM | POA: Diagnosis not present

## 2020-02-10 DIAGNOSIS — I4891 Unspecified atrial fibrillation: Secondary | ICD-10-CM | POA: Diagnosis not present

## 2020-02-10 DIAGNOSIS — M25511 Pain in right shoulder: Secondary | ICD-10-CM | POA: Diagnosis not present

## 2020-02-10 DIAGNOSIS — N1832 Chronic kidney disease, stage 3b: Secondary | ICD-10-CM | POA: Diagnosis not present

## 2020-02-22 DIAGNOSIS — I482 Chronic atrial fibrillation, unspecified: Secondary | ICD-10-CM | POA: Diagnosis not present

## 2020-03-11 NOTE — Progress Notes (Signed)
Cardiology Office Note:    Date:  03/12/2020   ID:  Tracey Allen, DOB [birth year], MRN [id number]  PCP:  Townsend Roger, MD  Cardiologist:  Shirlee More, MD    Referring MD: Townsend Roger, MD    ASSESSMENT:    1. Chronic atrial fibrillation ([place])   2. Chronic anticoagulation   3. Hypertensive heart disease with heart failure (HCC)   4. Chest pain of uncertain etiology    PLAN:    In order of problems listed above:  1. Stable rate is controlled off digoxin I am not sure if she was toxic but certainly is not needed and I think with changes in her for the behavior instead her not to be taking medication with CNS side effects.  Rate controlled continue low-dose calcium channel blocker along with reduced dose anticoagulant.  Fortunately there is no further falls. 2. Hypertension stable blood pressure target continue current treatment diuretic ARB at this time has no evidence of decompensated heart failure 3. Chest pain atypical seems to be musculoskeletal we will check an EKG going up to work I be very hesitant to consider ischemia evaluation at this time   Next appointment: 6 weeks   Medication Adjustments/Labs and Tests Ordered: Current medicines are reviewed at length with the patient today.  Concerns regarding medicines are outlined above.  No orders of the defined types were placed in this encounter.  No orders of the defined types were placed in this encounter.   Chief Complaint  Patient presents with  . Follow-up    After event monitor    History of Present Illness:    Tracey Allen is a 86 y.o. female with a hx of CHF, permanent atrial fibrillation, HTN and CKD  last seen 01/06/2020.  At that time she is having episodes of difficulty with certain vertigo or syncope.  Digoxin was discontinued and her 7-day heart monitor showed atrial fibrillation without pauses. Compliance with diet, lifestyle and medications: Yes  In someway she is better she is off digoxin  went to physical therapy and is no longer having vertigo no further falls.  She is complaining of chest pain atypical nonexertional located along the left sternal border and she is tender to touch and I suspect this is traumatic from a fall a few weeks ago.  She has a prescription for nitroglycerin but has not needed it we will check an EKG going off the office.  Her daughter is present I reviewed the molecular results with her and there is no indication she needs a pacemaker and her atrial fibrillation is well controlled on a single agent.  No edema orthopnea shortness of breath and she is not in syncope.  She continues her anticoagulant without bleeding complication.  The family is increasingly concerned about behavior and safety at home.  Digoxin level was not toxic: Ref Range & Units 2 mo ago 8 mo ago 2 yr ago   Digoxin, Serum 0.5 - 0.9 ng/mL 0.6  0.8 CM  0.5    Past Medical History:  Diagnosis Date  . Arthritis   . Chronic anticoagulation 11/12/2015  . Chronic pain of both knees 03/09/2017  . Congestive heart failure (CHF) ([place]) 07/27/2018  . GERD (gastroesophageal reflux disease)   . Hypertension   . Injury of right knee 05/18/2017  . Neuropathy   . Onychomycosis due to dermatophyte 11/11/2016  . Pain in both lower legs 05/05/2016  . Persistent atrial fibrillation ([place]) 11/12/2015  . Sinus bradycardia  11/12/2015  . Stomach ulcer   . Urination, excessive at night     Past Surgical History:  Procedure Laterality Date  . CATARACT EXTRACTION     bilateral  . OVARIAN CYST REMOVAL    . TOTAL KNEE ARTHROPLASTY  12/08/2011   Procedure: TOTAL KNEE ARTHROPLASTY;  Surgeon: Rudean Haskell, MD;  Location: [facility];  Service: Orthopedics;  Laterality: Left;  Total Knee Arthroplasty Left     Current Medications: Current Meds  Medication Sig  . Calcium-Magnesium-Vitamin D (CALCIUM 500 PO) Take 1,000 mg by mouth daily.  . Cholecalciferol (VITAMIN D PO) Take 1 tablet by mouth daily.  . DULoxetine  (CYMBALTA) 20 MG capsule Take 20 mg by mouth daily.   [name] omeprazole (PRILOSEC) 20 MG capsule TAKE ONE CAPSULE BY MOUTH DAILY and TAKE ONE CAPSULE BY MOUTH DAILY AT 8 PM  . Rivaroxaban (XARELTO) 15 MG TABS tablet Take 1 tablet (15 mg total) by mouth every evening.     Allergies:   Amoxicillin, Codeine, Norvasc [amlodipine besylate], Other, and Penicillins   Social History   Socioeconomic History  . Marital status: Divorced    Spouse name: Not on file  . Number of children: Not on file  . Years of education: Not on file  . Highest education level: Not on file  Occupational History  . Not on file  Tobacco Use  . Smoking status: Former Smoker    Types: Cigarettes  . Smokeless tobacco: Never Used  Substance and Sexual Activity  . Alcohol use: No  . Drug use: No  . Sexual activity: Not on file  Other Topics Concern  . Not on file  Social History Narrative  . Not on file   Social Determinants of Health   Financial Resource Strain:   . Difficulty of Paying Living Expenses:   Food Insecurity:   . Worried About Charity fundraiser in the Last Year:   . Arboriculturist in the Last Year:   Transportation Needs:   . Film/video editor (Medical):   [name] Lack of Transportation (Non-Medical):   Physical Activity:   . Days of Exercise per Week:   . Minutes of Exercise per Session:   Stress:   . Feeling of Stress :   Social Connections:   . Frequency of Communication with Friends and Family:   . Frequency of Social Gatherings with Friends and Family:   . Attends Religious Services:   . Active Member of Clubs or Organizations:   . Attends Archivist Meetings:   [name] Marital Status:      Family History: The patient's family history includes Cervical cancer in her sister; Heart attack in her brother and father; Hypertension in her mother. ROS:   Please see the history of present illness.    All other systems reviewed and are negative.  EKGs/Labs/Other Studies Reviewed:      The following studies were reviewed today:  EKG:  EKG ordered today and personally reviewed.  The ekg ordered today demonstrates atrial fibrillation nonspecific T waves no acute ischemic changes  Recent Labs: 01/06/2020: BUN 16; Creatinine, Ser 1.38; Hemoglobin 13.2; Platelets 258; Potassium 3.9; Sodium 145  Recent Lipid Panel No results found for: CHOL, TRIG, HDL, CHOLHDL, VLDL, LDLCALC, LDLDIRECT  Physical Exam:    VS:  BP 122/78   Pulse 94   Temp 97.7 F (36.5 C)   Ht 5\' 2"  (1.575 m)   Wt 150 lb 12.8 oz (68.4 kg)   SpO2  98%   BMI 27.58 kg/m     Wt Readings from Last 3 Encounters:  03/12/20 150 lb 12.8 oz (68.4 kg)  01/06/20 146 lb (66.2 kg)  07/01/19 145 lb 9.6 oz (66 kg)     GEN: Looks increasingly frail well nourished, well developed in no acute distress HEENT: Normal NECK: No JVD; No carotid bruits LYMPHATICS: No lymphadenopathy CARDIAC: Irregular S1 variable RRR, no murmurs, rubs, gallops RESPIRATORY:  Clear to auscultation without rales, wheezing or rhonchi  ABDOMEN: Soft, non-tender, non-distended MUSCULOSKELETAL:  No edema; No deformity  SKIN: Warm and dry NEUROLOGIC:  Alert and oriented x 3 PSYCHIATRIC:  Normal affect    Signed, Shirlee More, MD  03/12/2020 2:56 PM    [facility]

## 2020-03-12 ENCOUNTER — Ambulatory Visit: Payer: Medicare Other | Admitting: Cardiology

## 2020-03-12 ENCOUNTER — Encounter: Payer: Self-pay | Admitting: Cardiology

## 2020-03-12 ENCOUNTER — Other Ambulatory Visit: Payer: Self-pay

## 2020-03-12 VITALS — BP 122/78 | HR 94 | Temp 97.7°F | Ht 62.0 in | Wt 150.8 lb

## 2020-03-12 DIAGNOSIS — I482 Chronic atrial fibrillation, unspecified: Secondary | ICD-10-CM | POA: Diagnosis not present

## 2020-03-12 DIAGNOSIS — I110 Hypertensive heart disease with heart failure: Secondary | ICD-10-CM | POA: Diagnosis not present

## 2020-03-12 DIAGNOSIS — Z7901 Long term (current) use of anticoagulants: Secondary | ICD-10-CM

## 2020-03-12 DIAGNOSIS — R079 Chest pain, unspecified: Secondary | ICD-10-CM

## 2020-03-12 NOTE — Patient Instructions (Signed)

## 2020-03-13 ENCOUNTER — Other Ambulatory Visit: Payer: Self-pay | Admitting: Cardiology

## 2020-03-14 NOTE — Addendum Note (Signed)
Addended by: Kadiatou Oplinger, Jonelle Sidle L on: 03/14/2020 12:43 PM   Modules accepted: Orders

## 2020-03-22 DIAGNOSIS — H353221 Exudative age-related macular degeneration, left eye, with active choroidal neovascularization: Secondary | ICD-10-CM | POA: Diagnosis not present

## 2020-04-19 NOTE — Progress Notes (Deleted)
Cardiology Office Note:    Date:  04/19/2020   ID:  Tracey Allen, DOB 24-Jan-1925, MRN [id number]  PCP:  Townsend Roger, MD  Cardiologist:  Shirlee More, MD    Referring MD: Nona Dell, Corene Cornea, MD    ASSESSMENT:    No diagnosis found. PLAN:    In order of problems listed above:  1. ***   Next appointment: ***   Medication Adjustments/Labs and Tests Ordered: Current medicines are reviewed at length with the patient today.  Concerns regarding medicines are outlined above.  No orders of the defined types were placed in this encounter.  No orders of the defined types were placed in this encounter.   No chief complaint on file.   History of Present Illness:    Tracey Allen is a 86 y.o. female with a hx of CHF, permanent atrial fibrillation, HTN and CKD  seen 01/06/2020.  At that time she is having episodes of difficulty with uncertain vertigo or syncope.  Digoxin was discontinued and her 7-day heart monitor showed atrial fibrillation without pauses.   last seen 03/12/2020. Compliance with diet, lifestyle and medications: *** Past Medical History:  Diagnosis Date  . Arthritis   . Chronic anticoagulation 11/12/2015  . Chronic atrial fibrillation (HCC) 11/12/2015   CHADS2Vas Score [name] Score:  4  [name] annual stroke risk:  4%  . Chronic pain of both knees 03/09/2017  . Congestive heart failure (CHF) ([place]) 07/27/2018  . GERD (gastroesophageal reflux disease)   . High risk medication use 07/28/2017   digoxin  . Hypertension   . Hypertensive heart disease with heart failure ([place]) 07/01/2017  . Injury of right knee 05/18/2017  . Neuropathy   . Onychomycosis due to dermatophyte 11/11/2016  . Pain due to onychomycosis of toenails of both feet 10/04/2019  . Pain in both lower legs 05/05/2016  . Persistent atrial fibrillation ([place]) 11/12/2015  . Sinus bradycardia 11/12/2015  . SSS (sick sinus syndrome) ([place]) 08/19/2018   She had previous syncope with PAF  and a beta blocker  . Stomach ulcer   . Urination, excessive at night     Past Surgical History:  Procedure Laterality Date  . CATARACT EXTRACTION     bilateral  . OVARIAN CYST REMOVAL    . TOTAL KNEE ARTHROPLASTY  12/08/2011   Procedure: TOTAL KNEE ARTHROPLASTY;  Surgeon: Rudean Haskell, MD;  Location: [facility];  Service: Orthopedics;  Laterality: Left;  Total Knee Arthroplasty Left     Current Medications: No outpatient medications have been marked as taking for the 04/20/20 encounter (Appointment) with Richardo Priest, MD.     Allergies:   Amoxicillin, Codeine, Norvasc [amlodipine besylate], Other, and Penicillins   Social History   Socioeconomic History  . Marital status: Divorced    Spouse name: Not on file  . Number of children: Not on file  . Years of education: Not on file  . Highest education level: Not on file  Occupational History  . Not on file  Tobacco Use  . Smoking status: Former Smoker    Types: Cigarettes  . Smokeless tobacco: Never Used  Substance and Sexual Activity  . Alcohol use: No  . Drug use: No  . Sexual activity: Not on file  Other Topics Concern  . Not on file  Social History Narrative  . Not on file   Social Determinants of Health   Financial Resource Strain:   . Difficulty of Paying Living Expenses:  Food Insecurity:   . Worried About Charity fundraiser in the Last Year:   . Arboriculturist in the Last Year:   Transportation Needs:   . Film/video editor (Medical):   [name] Lack of Transportation (Non-Medical):   Physical Activity:   . Days of Exercise per Week:   . Minutes of Exercise per Session:   Stress:   . Feeling of Stress :   Social Connections:   . Frequency of Communication with Friends and Family:   . Frequency of Social Gatherings with Friends and Family:   . Attends Religious Services:   . Active Member of Clubs or Organizations:   . Attends Archivist Meetings:   [name] Marital Status:      Family  History: The patient's ***family history includes Cervical cancer in her sister; Heart attack in her brother and father; Hypertension in her mother. ROS:   Please see the history of present illness.    All other systems reviewed and are negative.  EKGs/Labs/Other Studies Reviewed:    The following studies were reviewed today:  EKG:  EKG ordered today and personally reviewed.  The ekg ordered today demonstrates ***  Recent Labs: 01/06/2020: BUN 16; Creatinine, Ser 1.38; Hemoglobin 13.2; Platelets 258; Potassium 3.9; Sodium 145  Recent Lipid Panel No results found for: CHOL, TRIG, HDL, CHOLHDL, VLDL, LDLCALC, LDLDIRECT  Physical Exam:    VS:  There were no vitals taken for this visit.    Wt Readings from Last 3 Encounters:  03/12/20 150 lb 12.8 oz (68.4 kg)  01/06/20 146 lb (66.2 kg)  07/01/19 145 lb 9.6 oz (66 kg)     GEN: *** Well nourished, well developed in no acute distress HEENT: Normal NECK: No JVD; No carotid bruits LYMPHATICS: No lymphadenopathy CARDIAC: ***RRR, no murmurs, rubs, gallops RESPIRATORY:  Clear to auscultation without rales, wheezing or rhonchi  ABDOMEN: Soft, non-tender, non-distended MUSCULOSKELETAL:  No edema; No deformity  SKIN: Warm and dry NEUROLOGIC:  Alert and oriented x 3 PSYCHIATRIC:  Normal affect    Signed, Shirlee More, MD  04/19/2020 4:00 PM    [facility]

## 2020-04-20 ENCOUNTER — Ambulatory Visit: Payer: Medicare Other | Admitting: Cardiology

## 2020-04-26 DIAGNOSIS — M25562 Pain in left knee: Secondary | ICD-10-CM | POA: Diagnosis not present

## 2020-04-26 DIAGNOSIS — M25561 Pain in right knee: Secondary | ICD-10-CM | POA: Diagnosis not present

## 2020-05-17 DIAGNOSIS — H353221 Exudative age-related macular degeneration, left eye, with active choroidal neovascularization: Secondary | ICD-10-CM | POA: Diagnosis not present

## 2020-06-04 DIAGNOSIS — R413 Other amnesia: Secondary | ICD-10-CM | POA: Diagnosis not present

## 2020-06-04 DIAGNOSIS — K219 Gastro-esophageal reflux disease without esophagitis: Secondary | ICD-10-CM | POA: Diagnosis not present

## 2020-06-04 DIAGNOSIS — I10 Essential (primary) hypertension: Secondary | ICD-10-CM | POA: Diagnosis not present

## 2020-06-04 DIAGNOSIS — Z79899 Other long term (current) drug therapy: Secondary | ICD-10-CM | POA: Diagnosis not present

## 2020-06-04 DIAGNOSIS — M7989 Other specified soft tissue disorders: Secondary | ICD-10-CM | POA: Diagnosis not present

## 2020-06-04 DIAGNOSIS — I4891 Unspecified atrial fibrillation: Secondary | ICD-10-CM | POA: Diagnosis not present

## 2020-06-05 DIAGNOSIS — B351 Tinea unguium: Secondary | ICD-10-CM | POA: Diagnosis not present

## 2020-06-11 ENCOUNTER — Other Ambulatory Visit: Payer: Self-pay | Admitting: Cardiology

## 2020-07-01 DIAGNOSIS — I4891 Unspecified atrial fibrillation: Secondary | ICD-10-CM | POA: Diagnosis not present

## 2020-07-01 DIAGNOSIS — I509 Heart failure, unspecified: Secondary | ICD-10-CM | POA: Diagnosis not present

## 2020-07-02 ENCOUNTER — Telehealth: Payer: Self-pay | Admitting: Cardiology

## 2020-07-02 DIAGNOSIS — Z87891 Personal history of nicotine dependence: Secondary | ICD-10-CM | POA: Diagnosis not present

## 2020-07-02 DIAGNOSIS — Z7902 Long term (current) use of antithrombotics/antiplatelets: Secondary | ICD-10-CM | POA: Diagnosis not present

## 2020-07-02 DIAGNOSIS — I340 Nonrheumatic mitral (valve) insufficiency: Secondary | ICD-10-CM | POA: Diagnosis not present

## 2020-07-02 DIAGNOSIS — R0989 Other specified symptoms and signs involving the circulatory and respiratory systems: Secondary | ICD-10-CM | POA: Diagnosis not present

## 2020-07-02 DIAGNOSIS — R000 Tachycardia, unspecified: Secondary | ICD-10-CM | POA: Diagnosis not present

## 2020-07-02 DIAGNOSIS — I5033 Acute on chronic diastolic (congestive) heart failure: Secondary | ICD-10-CM | POA: Diagnosis not present

## 2020-07-02 DIAGNOSIS — Z880 Allergy status to penicillin: Secondary | ICD-10-CM | POA: Diagnosis not present

## 2020-07-02 DIAGNOSIS — Z79899 Other long term (current) drug therapy: Secondary | ICD-10-CM | POA: Diagnosis not present

## 2020-07-02 DIAGNOSIS — I482 Chronic atrial fibrillation, unspecified: Secondary | ICD-10-CM | POA: Diagnosis not present

## 2020-07-02 DIAGNOSIS — R531 Weakness: Secondary | ICD-10-CM | POA: Diagnosis not present

## 2020-07-02 DIAGNOSIS — I700 Atherosclerosis of aorta: Secondary | ICD-10-CM | POA: Diagnosis not present

## 2020-07-02 DIAGNOSIS — I110 Hypertensive heart disease with heart failure: Secondary | ICD-10-CM | POA: Diagnosis not present

## 2020-07-02 DIAGNOSIS — I361 Nonrheumatic tricuspid (valve) insufficiency: Secondary | ICD-10-CM | POA: Diagnosis not present

## 2020-07-02 DIAGNOSIS — Z881 Allergy status to other antibiotic agents status: Secondary | ICD-10-CM | POA: Diagnosis not present

## 2020-07-02 DIAGNOSIS — Z743 Need for continuous supervision: Secondary | ICD-10-CM | POA: Diagnosis not present

## 2020-07-02 DIAGNOSIS — Z7901 Long term (current) use of anticoagulants: Secondary | ICD-10-CM | POA: Diagnosis not present

## 2020-07-02 DIAGNOSIS — E876 Hypokalemia: Secondary | ICD-10-CM | POA: Diagnosis not present

## 2020-07-02 DIAGNOSIS — I499 Cardiac arrhythmia, unspecified: Secondary | ICD-10-CM | POA: Diagnosis not present

## 2020-07-02 DIAGNOSIS — I517 Cardiomegaly: Secondary | ICD-10-CM | POA: Diagnosis not present

## 2020-07-02 DIAGNOSIS — R079 Chest pain, unspecified: Secondary | ICD-10-CM | POA: Diagnosis not present

## 2020-07-02 DIAGNOSIS — R0789 Other chest pain: Secondary | ICD-10-CM | POA: Diagnosis not present

## 2020-07-02 DIAGNOSIS — R072 Precordial pain: Secondary | ICD-10-CM | POA: Diagnosis not present

## 2020-07-02 DIAGNOSIS — I119 Hypertensive heart disease without heart failure: Secondary | ICD-10-CM | POA: Diagnosis not present

## 2020-07-02 DIAGNOSIS — Z885 Allergy status to narcotic agent status: Secondary | ICD-10-CM | POA: Diagnosis not present

## 2020-07-02 DIAGNOSIS — K219 Gastro-esophageal reflux disease without esophagitis: Secondary | ICD-10-CM | POA: Diagnosis not present

## 2020-07-02 NOTE — Telephone Encounter (Signed)
[facility] Emergency room called and they said that they need an current EKG from Dr. Bettina Gavia. Please call at [phone]

## 2020-07-02 NOTE — Telephone Encounter (Signed)
Called and spoke with Museum/gallery conservator at [facility] ED and let her know that I do not see where a stress test has been completed on this patient in our office. She verbalizes understanding and states that they will be doing one there.   Encouraged patient to call back with any questions or concerns.

## 2020-07-03 DIAGNOSIS — Z7901 Long term (current) use of anticoagulants: Secondary | ICD-10-CM

## 2020-07-03 DIAGNOSIS — I119 Hypertensive heart disease without heart failure: Secondary | ICD-10-CM

## 2020-07-03 DIAGNOSIS — I482 Chronic atrial fibrillation, unspecified: Secondary | ICD-10-CM

## 2020-07-03 DIAGNOSIS — I110 Hypertensive heart disease with heart failure: Secondary | ICD-10-CM | POA: Diagnosis not present

## 2020-07-03 DIAGNOSIS — R079 Chest pain, unspecified: Secondary | ICD-10-CM

## 2020-07-03 DIAGNOSIS — K219 Gastro-esophageal reflux disease without esophagitis: Secondary | ICD-10-CM | POA: Diagnosis not present

## 2020-07-04 DIAGNOSIS — I482 Chronic atrial fibrillation, unspecified: Secondary | ICD-10-CM | POA: Diagnosis not present

## 2020-07-04 DIAGNOSIS — K219 Gastro-esophageal reflux disease without esophagitis: Secondary | ICD-10-CM | POA: Diagnosis not present

## 2020-07-04 DIAGNOSIS — I110 Hypertensive heart disease with heart failure: Secondary | ICD-10-CM | POA: Diagnosis not present

## 2020-07-05 DIAGNOSIS — I482 Chronic atrial fibrillation, unspecified: Secondary | ICD-10-CM | POA: Diagnosis not present

## 2020-07-05 DIAGNOSIS — K219 Gastro-esophageal reflux disease without esophagitis: Secondary | ICD-10-CM | POA: Diagnosis not present

## 2020-07-05 DIAGNOSIS — I110 Hypertensive heart disease with heart failure: Secondary | ICD-10-CM | POA: Diagnosis not present

## 2020-07-07 DIAGNOSIS — K219 Gastro-esophageal reflux disease without esophagitis: Secondary | ICD-10-CM | POA: Diagnosis not present

## 2020-07-07 DIAGNOSIS — Z9181 History of falling: Secondary | ICD-10-CM | POA: Diagnosis not present

## 2020-07-07 DIAGNOSIS — I5033 Acute on chronic diastolic (congestive) heart failure: Secondary | ICD-10-CM | POA: Diagnosis not present

## 2020-07-07 DIAGNOSIS — I482 Chronic atrial fibrillation, unspecified: Secondary | ICD-10-CM | POA: Diagnosis not present

## 2020-07-07 DIAGNOSIS — Z7901 Long term (current) use of anticoagulants: Secondary | ICD-10-CM | POA: Diagnosis not present

## 2020-07-07 DIAGNOSIS — I110 Hypertensive heart disease with heart failure: Secondary | ICD-10-CM | POA: Diagnosis not present

## 2020-07-08 DIAGNOSIS — K219 Gastro-esophageal reflux disease without esophagitis: Secondary | ICD-10-CM | POA: Diagnosis not present

## 2020-07-08 DIAGNOSIS — I5033 Acute on chronic diastolic (congestive) heart failure: Secondary | ICD-10-CM | POA: Diagnosis not present

## 2020-07-08 DIAGNOSIS — I110 Hypertensive heart disease with heart failure: Secondary | ICD-10-CM | POA: Diagnosis not present

## 2020-07-08 DIAGNOSIS — Z7901 Long term (current) use of anticoagulants: Secondary | ICD-10-CM | POA: Diagnosis not present

## 2020-07-08 DIAGNOSIS — I482 Chronic atrial fibrillation, unspecified: Secondary | ICD-10-CM | POA: Diagnosis not present

## 2020-07-08 DIAGNOSIS — Z9181 History of falling: Secondary | ICD-10-CM | POA: Diagnosis not present

## 2020-07-11 ENCOUNTER — Other Ambulatory Visit: Payer: Self-pay | Admitting: Cardiology

## 2020-07-12 DIAGNOSIS — R0602 Shortness of breath: Secondary | ICD-10-CM | POA: Diagnosis not present

## 2020-07-12 DIAGNOSIS — E538 Deficiency of other specified B group vitamins: Secondary | ICD-10-CM | POA: Diagnosis not present

## 2020-07-12 DIAGNOSIS — I4891 Unspecified atrial fibrillation: Secondary | ICD-10-CM | POA: Diagnosis not present

## 2020-07-12 DIAGNOSIS — Z79899 Other long term (current) drug therapy: Secondary | ICD-10-CM | POA: Diagnosis not present

## 2020-07-12 DIAGNOSIS — R300 Dysuria: Secondary | ICD-10-CM | POA: Diagnosis not present

## 2020-07-12 DIAGNOSIS — N390 Urinary tract infection, site not specified: Secondary | ICD-10-CM | POA: Diagnosis not present

## 2020-07-13 DIAGNOSIS — I482 Chronic atrial fibrillation, unspecified: Secondary | ICD-10-CM | POA: Diagnosis not present

## 2020-07-13 DIAGNOSIS — I5033 Acute on chronic diastolic (congestive) heart failure: Secondary | ICD-10-CM | POA: Diagnosis not present

## 2020-07-13 DIAGNOSIS — Z9181 History of falling: Secondary | ICD-10-CM | POA: Diagnosis not present

## 2020-07-13 DIAGNOSIS — K219 Gastro-esophageal reflux disease without esophagitis: Secondary | ICD-10-CM | POA: Diagnosis not present

## 2020-07-13 DIAGNOSIS — Z7901 Long term (current) use of anticoagulants: Secondary | ICD-10-CM | POA: Diagnosis not present

## 2020-07-13 DIAGNOSIS — I110 Hypertensive heart disease with heart failure: Secondary | ICD-10-CM | POA: Diagnosis not present

## 2020-07-16 DIAGNOSIS — E538 Deficiency of other specified B group vitamins: Secondary | ICD-10-CM | POA: Diagnosis not present

## 2020-07-25 DIAGNOSIS — I482 Chronic atrial fibrillation, unspecified: Secondary | ICD-10-CM | POA: Diagnosis not present

## 2020-07-25 DIAGNOSIS — I5033 Acute on chronic diastolic (congestive) heart failure: Secondary | ICD-10-CM | POA: Diagnosis not present

## 2020-07-25 DIAGNOSIS — K219 Gastro-esophageal reflux disease without esophagitis: Secondary | ICD-10-CM | POA: Diagnosis not present

## 2020-07-25 DIAGNOSIS — I110 Hypertensive heart disease with heart failure: Secondary | ICD-10-CM | POA: Diagnosis not present

## 2020-07-25 DIAGNOSIS — Z7901 Long term (current) use of anticoagulants: Secondary | ICD-10-CM | POA: Diagnosis not present

## 2020-07-26 DIAGNOSIS — I5033 Acute on chronic diastolic (congestive) heart failure: Secondary | ICD-10-CM | POA: Diagnosis not present

## 2020-07-26 DIAGNOSIS — Z9181 History of falling: Secondary | ICD-10-CM | POA: Diagnosis not present

## 2020-07-26 DIAGNOSIS — I482 Chronic atrial fibrillation, unspecified: Secondary | ICD-10-CM | POA: Diagnosis not present

## 2020-07-26 DIAGNOSIS — I110 Hypertensive heart disease with heart failure: Secondary | ICD-10-CM | POA: Diagnosis not present

## 2020-07-26 DIAGNOSIS — K219 Gastro-esophageal reflux disease without esophagitis: Secondary | ICD-10-CM | POA: Diagnosis not present

## 2020-07-26 DIAGNOSIS — Z7901 Long term (current) use of anticoagulants: Secondary | ICD-10-CM | POA: Diagnosis not present

## 2020-07-30 DIAGNOSIS — Z79899 Other long term (current) drug therapy: Secondary | ICD-10-CM | POA: Diagnosis not present

## 2020-07-30 DIAGNOSIS — M7989 Other specified soft tissue disorders: Secondary | ICD-10-CM | POA: Diagnosis not present

## 2020-07-30 DIAGNOSIS — M792 Neuralgia and neuritis, unspecified: Secondary | ICD-10-CM | POA: Diagnosis not present

## 2020-08-03 DIAGNOSIS — Z9181 History of falling: Secondary | ICD-10-CM | POA: Diagnosis not present

## 2020-08-03 DIAGNOSIS — I482 Chronic atrial fibrillation, unspecified: Secondary | ICD-10-CM | POA: Diagnosis not present

## 2020-08-03 DIAGNOSIS — Z7901 Long term (current) use of anticoagulants: Secondary | ICD-10-CM | POA: Diagnosis not present

## 2020-08-03 DIAGNOSIS — I110 Hypertensive heart disease with heart failure: Secondary | ICD-10-CM | POA: Diagnosis not present

## 2020-08-03 DIAGNOSIS — K219 Gastro-esophageal reflux disease without esophagitis: Secondary | ICD-10-CM | POA: Diagnosis not present

## 2020-08-03 DIAGNOSIS — I5033 Acute on chronic diastolic (congestive) heart failure: Secondary | ICD-10-CM | POA: Diagnosis not present

## 2020-08-07 DIAGNOSIS — Z79899 Other long term (current) drug therapy: Secondary | ICD-10-CM | POA: Diagnosis not present

## 2020-08-08 DIAGNOSIS — I110 Hypertensive heart disease with heart failure: Secondary | ICD-10-CM | POA: Diagnosis not present

## 2020-08-08 DIAGNOSIS — Z7901 Long term (current) use of anticoagulants: Secondary | ICD-10-CM | POA: Diagnosis not present

## 2020-08-08 DIAGNOSIS — Z9181 History of falling: Secondary | ICD-10-CM | POA: Diagnosis not present

## 2020-08-08 DIAGNOSIS — N390 Urinary tract infection, site not specified: Secondary | ICD-10-CM | POA: Diagnosis not present

## 2020-08-08 DIAGNOSIS — K219 Gastro-esophageal reflux disease without esophagitis: Secondary | ICD-10-CM | POA: Diagnosis not present

## 2020-08-08 DIAGNOSIS — I5033 Acute on chronic diastolic (congestive) heart failure: Secondary | ICD-10-CM | POA: Diagnosis not present

## 2020-08-08 DIAGNOSIS — I482 Chronic atrial fibrillation, unspecified: Secondary | ICD-10-CM | POA: Diagnosis not present

## 2020-08-10 NOTE — Telephone Encounter (Signed)
Did not need this encounter °

## 2020-08-13 DIAGNOSIS — K219 Gastro-esophageal reflux disease without esophagitis: Secondary | ICD-10-CM | POA: Diagnosis not present

## 2020-08-13 DIAGNOSIS — I482 Chronic atrial fibrillation, unspecified: Secondary | ICD-10-CM | POA: Diagnosis not present

## 2020-08-13 DIAGNOSIS — Z9181 History of falling: Secondary | ICD-10-CM | POA: Diagnosis not present

## 2020-08-13 DIAGNOSIS — I5033 Acute on chronic diastolic (congestive) heart failure: Secondary | ICD-10-CM | POA: Diagnosis not present

## 2020-08-13 DIAGNOSIS — Z7901 Long term (current) use of anticoagulants: Secondary | ICD-10-CM | POA: Diagnosis not present

## 2020-08-13 DIAGNOSIS — I110 Hypertensive heart disease with heart failure: Secondary | ICD-10-CM | POA: Diagnosis not present

## 2020-08-15 DIAGNOSIS — E538 Deficiency of other specified B group vitamins: Secondary | ICD-10-CM | POA: Diagnosis not present

## 2020-08-23 DIAGNOSIS — Z79899 Other long term (current) drug therapy: Secondary | ICD-10-CM | POA: Diagnosis not present

## 2020-09-04 DIAGNOSIS — Z79899 Other long term (current) drug therapy: Secondary | ICD-10-CM | POA: Diagnosis not present

## 2020-09-13 DIAGNOSIS — E538 Deficiency of other specified B group vitamins: Secondary | ICD-10-CM | POA: Diagnosis not present

## 2020-09-13 DIAGNOSIS — H353221 Exudative age-related macular degeneration, left eye, with active choroidal neovascularization: Secondary | ICD-10-CM | POA: Diagnosis not present

## 2020-09-19 ENCOUNTER — Other Ambulatory Visit: Payer: Self-pay | Admitting: Cardiology

## 2020-09-20 ENCOUNTER — Other Ambulatory Visit: Payer: Self-pay | Admitting: Cardiology

## 2020-09-20 DIAGNOSIS — M25561 Pain in right knee: Secondary | ICD-10-CM | POA: Diagnosis not present

## 2020-09-20 DIAGNOSIS — M25562 Pain in left knee: Secondary | ICD-10-CM | POA: Diagnosis not present

## 2020-09-20 DIAGNOSIS — G8929 Other chronic pain: Secondary | ICD-10-CM | POA: Diagnosis not present

## 2020-10-04 DIAGNOSIS — M79675 Pain in left toe(s): Secondary | ICD-10-CM | POA: Diagnosis not present

## 2020-10-04 DIAGNOSIS — B351 Tinea unguium: Secondary | ICD-10-CM | POA: Diagnosis not present

## 2020-10-04 DIAGNOSIS — M79674 Pain in right toe(s): Secondary | ICD-10-CM | POA: Diagnosis not present

## 2020-10-09 DIAGNOSIS — I4891 Unspecified atrial fibrillation: Secondary | ICD-10-CM | POA: Diagnosis not present

## 2020-10-09 DIAGNOSIS — D6869 Other thrombophilia: Secondary | ICD-10-CM | POA: Diagnosis not present

## 2020-10-09 DIAGNOSIS — I10 Essential (primary) hypertension: Secondary | ICD-10-CM | POA: Diagnosis not present

## 2020-10-09 DIAGNOSIS — E538 Deficiency of other specified B group vitamins: Secondary | ICD-10-CM | POA: Diagnosis not present

## 2020-10-09 DIAGNOSIS — Z79899 Other long term (current) drug therapy: Secondary | ICD-10-CM | POA: Diagnosis not present

## 2020-10-22 ENCOUNTER — Other Ambulatory Visit: Payer: Self-pay | Admitting: Cardiology

## 2020-11-07 DIAGNOSIS — E538 Deficiency of other specified B group vitamins: Secondary | ICD-10-CM | POA: Diagnosis not present

## 2020-11-11 DIAGNOSIS — S81812A Laceration without foreign body, left lower leg, initial encounter: Secondary | ICD-10-CM | POA: Diagnosis not present

## 2020-11-11 DIAGNOSIS — M25552 Pain in left hip: Secondary | ICD-10-CM | POA: Diagnosis not present

## 2020-11-15 DIAGNOSIS — W19XXXA Unspecified fall, initial encounter: Secondary | ICD-10-CM | POA: Diagnosis not present

## 2020-11-15 DIAGNOSIS — S0990XA Unspecified injury of head, initial encounter: Secondary | ICD-10-CM | POA: Diagnosis not present

## 2020-11-15 DIAGNOSIS — M25552 Pain in left hip: Secondary | ICD-10-CM | POA: Diagnosis not present

## 2020-11-15 DIAGNOSIS — S32415A Nondisplaced fracture of anterior wall of left acetabulum, initial encounter for closed fracture: Secondary | ICD-10-CM | POA: Diagnosis not present

## 2020-11-15 DIAGNOSIS — S32502A Unspecified fracture of left pubis, initial encounter for closed fracture: Secondary | ICD-10-CM | POA: Diagnosis not present

## 2020-11-15 DIAGNOSIS — Z7409 Other reduced mobility: Secondary | ICD-10-CM | POA: Diagnosis not present

## 2020-11-15 DIAGNOSIS — I4819 Other persistent atrial fibrillation: Secondary | ICD-10-CM | POA: Diagnosis not present

## 2020-11-15 DIAGNOSIS — S32592A Other specified fracture of left pubis, initial encounter for closed fracture: Secondary | ICD-10-CM | POA: Diagnosis not present

## 2020-11-15 DIAGNOSIS — D649 Anemia, unspecified: Secondary | ICD-10-CM | POA: Diagnosis not present

## 2020-11-15 DIAGNOSIS — Z79899 Other long term (current) drug therapy: Secondary | ICD-10-CM | POA: Diagnosis not present

## 2020-11-15 DIAGNOSIS — Z743 Need for continuous supervision: Secondary | ICD-10-CM | POA: Diagnosis not present

## 2020-11-15 DIAGNOSIS — S72002A Fracture of unspecified part of neck of left femur, initial encounter for closed fracture: Secondary | ICD-10-CM | POA: Diagnosis not present

## 2020-11-15 DIAGNOSIS — I499 Cardiac arrhythmia, unspecified: Secondary | ICD-10-CM | POA: Diagnosis not present

## 2020-11-15 DIAGNOSIS — K5730 Diverticulosis of large intestine without perforation or abscess without bleeding: Secondary | ICD-10-CM | POA: Diagnosis not present

## 2020-11-15 DIAGNOSIS — S0083XA Contusion of other part of head, initial encounter: Secondary | ICD-10-CM | POA: Diagnosis not present

## 2020-11-15 DIAGNOSIS — R52 Pain, unspecified: Secondary | ICD-10-CM | POA: Diagnosis not present

## 2020-11-16 DIAGNOSIS — I10 Essential (primary) hypertension: Secondary | ICD-10-CM | POA: Diagnosis not present

## 2020-11-16 DIAGNOSIS — I4819 Other persistent atrial fibrillation: Secondary | ICD-10-CM | POA: Diagnosis not present

## 2020-11-16 DIAGNOSIS — S32415A Nondisplaced fracture of anterior wall of left acetabulum, initial encounter for closed fracture: Secondary | ICD-10-CM | POA: Diagnosis not present

## 2020-11-16 DIAGNOSIS — W19XXXA Unspecified fall, initial encounter: Secondary | ICD-10-CM | POA: Diagnosis not present

## 2020-11-16 DIAGNOSIS — S32592A Other specified fracture of left pubis, initial encounter for closed fracture: Secondary | ICD-10-CM | POA: Diagnosis not present

## 2020-11-16 DIAGNOSIS — D649 Anemia, unspecified: Secondary | ICD-10-CM | POA: Diagnosis not present

## 2020-11-21 DIAGNOSIS — G8929 Other chronic pain: Secondary | ICD-10-CM | POA: Diagnosis not present

## 2020-11-21 DIAGNOSIS — I4819 Other persistent atrial fibrillation: Secondary | ICD-10-CM | POA: Diagnosis not present

## 2020-11-21 DIAGNOSIS — Z7901 Long term (current) use of anticoagulants: Secondary | ICD-10-CM | POA: Diagnosis not present

## 2020-11-21 DIAGNOSIS — Z9181 History of falling: Secondary | ICD-10-CM | POA: Diagnosis not present

## 2020-11-21 DIAGNOSIS — M25562 Pain in left knee: Secondary | ICD-10-CM | POA: Diagnosis not present

## 2020-11-21 DIAGNOSIS — D649 Anemia, unspecified: Secondary | ICD-10-CM | POA: Diagnosis not present

## 2020-11-21 DIAGNOSIS — M25561 Pain in right knee: Secondary | ICD-10-CM | POA: Diagnosis not present

## 2020-11-21 DIAGNOSIS — S32415D Nondisplaced fracture of anterior wall of left acetabulum, subsequent encounter for fracture with routine healing: Secondary | ICD-10-CM | POA: Diagnosis not present

## 2020-11-21 DIAGNOSIS — S32592D Other specified fracture of left pubis, subsequent encounter for fracture with routine healing: Secondary | ICD-10-CM | POA: Diagnosis not present

## 2020-11-21 DIAGNOSIS — I10 Essential (primary) hypertension: Secondary | ICD-10-CM | POA: Diagnosis not present

## 2020-11-27 DIAGNOSIS — I10 Essential (primary) hypertension: Secondary | ICD-10-CM | POA: Diagnosis not present

## 2020-11-27 DIAGNOSIS — Z9181 History of falling: Secondary | ICD-10-CM | POA: Diagnosis not present

## 2020-11-27 DIAGNOSIS — G8929 Other chronic pain: Secondary | ICD-10-CM | POA: Diagnosis not present

## 2020-11-27 DIAGNOSIS — I4819 Other persistent atrial fibrillation: Secondary | ICD-10-CM | POA: Diagnosis not present

## 2020-11-27 DIAGNOSIS — Z7901 Long term (current) use of anticoagulants: Secondary | ICD-10-CM | POA: Diagnosis not present

## 2020-11-27 DIAGNOSIS — M25561 Pain in right knee: Secondary | ICD-10-CM | POA: Diagnosis not present

## 2020-11-27 DIAGNOSIS — S32415D Nondisplaced fracture of anterior wall of left acetabulum, subsequent encounter for fracture with routine healing: Secondary | ICD-10-CM | POA: Diagnosis not present

## 2020-11-27 DIAGNOSIS — S32592D Other specified fracture of left pubis, subsequent encounter for fracture with routine healing: Secondary | ICD-10-CM | POA: Diagnosis not present

## 2020-11-27 DIAGNOSIS — M25562 Pain in left knee: Secondary | ICD-10-CM | POA: Diagnosis not present

## 2020-11-27 DIAGNOSIS — D649 Anemia, unspecified: Secondary | ICD-10-CM | POA: Diagnosis not present

## 2020-11-29 DIAGNOSIS — Z7901 Long term (current) use of anticoagulants: Secondary | ICD-10-CM | POA: Diagnosis not present

## 2020-11-29 DIAGNOSIS — Z9181 History of falling: Secondary | ICD-10-CM | POA: Diagnosis not present

## 2020-11-29 DIAGNOSIS — M25562 Pain in left knee: Secondary | ICD-10-CM | POA: Diagnosis not present

## 2020-11-29 DIAGNOSIS — I4819 Other persistent atrial fibrillation: Secondary | ICD-10-CM | POA: Diagnosis not present

## 2020-11-29 DIAGNOSIS — D649 Anemia, unspecified: Secondary | ICD-10-CM | POA: Diagnosis not present

## 2020-11-29 DIAGNOSIS — G8929 Other chronic pain: Secondary | ICD-10-CM | POA: Diagnosis not present

## 2020-11-29 DIAGNOSIS — S32592D Other specified fracture of left pubis, subsequent encounter for fracture with routine healing: Secondary | ICD-10-CM | POA: Diagnosis not present

## 2020-11-29 DIAGNOSIS — M25561 Pain in right knee: Secondary | ICD-10-CM | POA: Diagnosis not present

## 2020-11-29 DIAGNOSIS — S32415D Nondisplaced fracture of anterior wall of left acetabulum, subsequent encounter for fracture with routine healing: Secondary | ICD-10-CM | POA: Diagnosis not present

## 2020-11-29 DIAGNOSIS — I10 Essential (primary) hypertension: Secondary | ICD-10-CM | POA: Diagnosis not present

## 2020-12-01 DIAGNOSIS — Z7189 Other specified counseling: Secondary | ICD-10-CM | POA: Diagnosis not present

## 2020-12-02 DIAGNOSIS — D649 Anemia, unspecified: Secondary | ICD-10-CM | POA: Diagnosis not present

## 2020-12-02 DIAGNOSIS — I10 Essential (primary) hypertension: Secondary | ICD-10-CM | POA: Diagnosis not present

## 2020-12-02 DIAGNOSIS — S32592D Other specified fracture of left pubis, subsequent encounter for fracture with routine healing: Secondary | ICD-10-CM | POA: Diagnosis not present

## 2020-12-02 DIAGNOSIS — M25561 Pain in right knee: Secondary | ICD-10-CM | POA: Diagnosis not present

## 2020-12-02 DIAGNOSIS — I4819 Other persistent atrial fibrillation: Secondary | ICD-10-CM | POA: Diagnosis not present

## 2020-12-02 DIAGNOSIS — S32415D Nondisplaced fracture of anterior wall of left acetabulum, subsequent encounter for fracture with routine healing: Secondary | ICD-10-CM | POA: Diagnosis not present

## 2020-12-02 DIAGNOSIS — Z7901 Long term (current) use of anticoagulants: Secondary | ICD-10-CM | POA: Diagnosis not present

## 2020-12-02 DIAGNOSIS — Z9181 History of falling: Secondary | ICD-10-CM | POA: Diagnosis not present

## 2020-12-02 DIAGNOSIS — G8929 Other chronic pain: Secondary | ICD-10-CM | POA: Diagnosis not present

## 2020-12-02 DIAGNOSIS — M25562 Pain in left knee: Secondary | ICD-10-CM | POA: Diagnosis not present

## 2020-12-04 DIAGNOSIS — Z7901 Long term (current) use of anticoagulants: Secondary | ICD-10-CM | POA: Diagnosis not present

## 2020-12-04 DIAGNOSIS — Z9181 History of falling: Secondary | ICD-10-CM | POA: Diagnosis not present

## 2020-12-04 DIAGNOSIS — S32592D Other specified fracture of left pubis, subsequent encounter for fracture with routine healing: Secondary | ICD-10-CM | POA: Diagnosis not present

## 2020-12-04 DIAGNOSIS — I10 Essential (primary) hypertension: Secondary | ICD-10-CM | POA: Diagnosis not present

## 2020-12-04 DIAGNOSIS — I4819 Other persistent atrial fibrillation: Secondary | ICD-10-CM | POA: Diagnosis not present

## 2020-12-04 DIAGNOSIS — D649 Anemia, unspecified: Secondary | ICD-10-CM | POA: Diagnosis not present

## 2020-12-04 DIAGNOSIS — M25561 Pain in right knee: Secondary | ICD-10-CM | POA: Diagnosis not present

## 2020-12-04 DIAGNOSIS — S32415D Nondisplaced fracture of anterior wall of left acetabulum, subsequent encounter for fracture with routine healing: Secondary | ICD-10-CM | POA: Diagnosis not present

## 2020-12-04 DIAGNOSIS — M25562 Pain in left knee: Secondary | ICD-10-CM | POA: Diagnosis not present

## 2020-12-04 DIAGNOSIS — G8929 Other chronic pain: Secondary | ICD-10-CM | POA: Diagnosis not present

## 2020-12-15 ENCOUNTER — Emergency Department (HOSPITAL_COMMUNITY): Payer: Medicare Other

## 2020-12-15 ENCOUNTER — Encounter (HOSPITAL_COMMUNITY): Payer: Self-pay | Admitting: Family Medicine

## 2020-12-15 ENCOUNTER — Inpatient Hospital Stay (HOSPITAL_COMMUNITY)
Admission: EM | Admit: 2020-12-15 | Discharge: 2020-12-20 | DRG: 177 | Disposition: A | Discharge status: Home Health | Payer: Medicare Other | Attending: Internal Medicine | Admitting: Internal Medicine

## 2020-12-15 DIAGNOSIS — G8929 Other chronic pain: Secondary | ICD-10-CM | POA: Diagnosis present

## 2020-12-15 DIAGNOSIS — Z66 Do not resuscitate: Secondary | ICD-10-CM | POA: Diagnosis present

## 2020-12-15 DIAGNOSIS — R079 Chest pain, unspecified: Secondary | ICD-10-CM | POA: Diagnosis not present

## 2020-12-15 DIAGNOSIS — Z79891 Long term (current) use of opiate analgesic: Secondary | ICD-10-CM | POA: Diagnosis not present

## 2020-12-15 DIAGNOSIS — I130 Hypertensive heart and chronic kidney disease with heart failure and stage 1 through stage 4 chronic kidney disease, or unspecified chronic kidney disease: Secondary | ICD-10-CM | POA: Diagnosis not present

## 2020-12-15 DIAGNOSIS — J9601 Acute respiratory failure with hypoxia: Secondary | ICD-10-CM | POA: Diagnosis not present

## 2020-12-15 DIAGNOSIS — S32415D Nondisplaced fracture of anterior wall of left acetabulum, subsequent encounter for fracture with routine healing: Secondary | ICD-10-CM | POA: Diagnosis not present

## 2020-12-15 DIAGNOSIS — R531 Weakness: Secondary | ICD-10-CM

## 2020-12-15 DIAGNOSIS — U071 COVID-19: Principal | ICD-10-CM | POA: Diagnosis present

## 2020-12-15 DIAGNOSIS — Z8049 Family history of malignant neoplasm of other genital organs: Secondary | ICD-10-CM

## 2020-12-15 DIAGNOSIS — K219 Gastro-esophageal reflux disease without esophagitis: Secondary | ICD-10-CM | POA: Diagnosis present

## 2020-12-15 DIAGNOSIS — Z781 Physical restraint status: Secondary | ICD-10-CM

## 2020-12-15 DIAGNOSIS — Z7401 Bed confinement status: Secondary | ICD-10-CM | POA: Diagnosis not present

## 2020-12-15 DIAGNOSIS — Z96652 Presence of left artificial knee joint: Secondary | ICD-10-CM | POA: Diagnosis not present

## 2020-12-15 DIAGNOSIS — G629 Polyneuropathy, unspecified: Secondary | ICD-10-CM | POA: Diagnosis not present

## 2020-12-15 DIAGNOSIS — Z880 Allergy status to penicillin: Secondary | ICD-10-CM

## 2020-12-15 DIAGNOSIS — R4182 Altered mental status, unspecified: Secondary | ICD-10-CM | POA: Diagnosis present

## 2020-12-15 DIAGNOSIS — G929 Unspecified toxic encephalopathy: Secondary | ICD-10-CM | POA: Diagnosis present

## 2020-12-15 DIAGNOSIS — M25561 Pain in right knee: Secondary | ICD-10-CM | POA: Diagnosis present

## 2020-12-15 DIAGNOSIS — I480 Paroxysmal atrial fibrillation: Secondary | ICD-10-CM | POA: Diagnosis not present

## 2020-12-15 DIAGNOSIS — S329XXD Fracture of unspecified parts of lumbosacral spine and pelvis, subsequent encounter for fracture with routine healing: Secondary | ICD-10-CM

## 2020-12-15 DIAGNOSIS — I517 Cardiomegaly: Secondary | ICD-10-CM | POA: Diagnosis not present

## 2020-12-15 DIAGNOSIS — R404 Transient alteration of awareness: Secondary | ICD-10-CM | POA: Diagnosis not present

## 2020-12-15 DIAGNOSIS — N179 Acute kidney failure, unspecified: Secondary | ICD-10-CM | POA: Diagnosis present

## 2020-12-15 DIAGNOSIS — M25562 Pain in left knee: Secondary | ICD-10-CM | POA: Diagnosis not present

## 2020-12-15 DIAGNOSIS — E860 Dehydration: Secondary | ICD-10-CM | POA: Diagnosis not present

## 2020-12-15 DIAGNOSIS — Z79899 Other long term (current) drug therapy: Secondary | ICD-10-CM

## 2020-12-15 DIAGNOSIS — I495 Sick sinus syndrome: Secondary | ICD-10-CM | POA: Diagnosis not present

## 2020-12-15 DIAGNOSIS — Z87891 Personal history of nicotine dependence: Secondary | ICD-10-CM

## 2020-12-15 DIAGNOSIS — I10 Essential (primary) hypertension: Secondary | ICD-10-CM

## 2020-12-15 DIAGNOSIS — R0602 Shortness of breath: Secondary | ICD-10-CM | POA: Diagnosis not present

## 2020-12-15 DIAGNOSIS — Z7901 Long term (current) use of anticoagulants: Secondary | ICD-10-CM

## 2020-12-15 DIAGNOSIS — I129 Hypertensive chronic kidney disease with stage 1 through stage 4 chronic kidney disease, or unspecified chronic kidney disease: Secondary | ICD-10-CM | POA: Diagnosis not present

## 2020-12-15 DIAGNOSIS — N183 Chronic kidney disease, stage 3 unspecified: Secondary | ICD-10-CM | POA: Diagnosis not present

## 2020-12-15 DIAGNOSIS — S32592D Other specified fracture of left pubis, subsequent encounter for fracture with routine healing: Secondary | ICD-10-CM | POA: Diagnosis not present

## 2020-12-15 DIAGNOSIS — Z888 Allergy status to other drugs, medicaments and biological substances status: Secondary | ICD-10-CM

## 2020-12-15 DIAGNOSIS — Z8249 Family history of ischemic heart disease and other diseases of the circulatory system: Secondary | ICD-10-CM | POA: Diagnosis not present

## 2020-12-15 DIAGNOSIS — I482 Chronic atrial fibrillation, unspecified: Secondary | ICD-10-CM | POA: Diagnosis present

## 2020-12-15 DIAGNOSIS — I509 Heart failure, unspecified: Secondary | ICD-10-CM | POA: Diagnosis not present

## 2020-12-15 DIAGNOSIS — R0789 Other chest pain: Secondary | ICD-10-CM | POA: Diagnosis not present

## 2020-12-15 DIAGNOSIS — R059 Cough, unspecified: Secondary | ICD-10-CM | POA: Diagnosis not present

## 2020-12-15 DIAGNOSIS — M1990 Unspecified osteoarthritis, unspecified site: Secondary | ICD-10-CM | POA: Diagnosis present

## 2020-12-15 DIAGNOSIS — R402411 Glasgow coma scale score 13-15, in the field [EMT or ambulance]: Secondary | ICD-10-CM | POA: Diagnosis not present

## 2020-12-15 DIAGNOSIS — F0390 Unspecified dementia without behavioral disturbance: Secondary | ICD-10-CM | POA: Diagnosis present

## 2020-12-15 DIAGNOSIS — N1831 Chronic kidney disease, stage 3a: Secondary | ICD-10-CM | POA: Diagnosis present

## 2020-12-15 DIAGNOSIS — D649 Anemia, unspecified: Secondary | ICD-10-CM | POA: Diagnosis not present

## 2020-12-15 DIAGNOSIS — Z885 Allergy status to narcotic agent status: Secondary | ICD-10-CM

## 2020-12-15 DIAGNOSIS — Z9181 History of falling: Secondary | ICD-10-CM | POA: Diagnosis not present

## 2020-12-15 DIAGNOSIS — I4891 Unspecified atrial fibrillation: Secondary | ICD-10-CM | POA: Diagnosis not present

## 2020-12-15 DIAGNOSIS — J1282 Pneumonia due to coronavirus disease 2019: Secondary | ICD-10-CM | POA: Diagnosis not present

## 2020-12-15 DIAGNOSIS — Z743 Need for continuous supervision: Secondary | ICD-10-CM | POA: Diagnosis not present

## 2020-12-15 DIAGNOSIS — M2550 Pain in unspecified joint: Secondary | ICD-10-CM | POA: Diagnosis not present

## 2020-12-15 HISTORY — DX: Chronic kidney disease, unspecified: N18.9

## 2020-12-15 LAB — CBC
HCT: 34 % — ABNORMAL LOW (ref 36.0–46.0)
Hemoglobin: 10.9 g/dL — ABNORMAL LOW (ref 12.0–15.0)
MCH: 30.1 pg (ref 26.0–34.0)
MCHC: 32.1 g/dL (ref 30.0–36.0)
MCV: 93.9 fL (ref 80.0–100.0)
Platelets: 303 10*3/uL (ref 150–400)
RBC: 3.62 MIL/uL — ABNORMAL LOW (ref 3.87–5.11)
RDW: 12.3 % (ref 11.5–15.5)
WBC: 7 10*3/uL (ref 4.0–10.5)
nRBC: 0 % (ref 0.0–0.2)

## 2020-12-15 LAB — URINALYSIS, ROUTINE W REFLEX MICROSCOPIC
Bilirubin Urine: NEGATIVE
Glucose, UA: NEGATIVE mg/dL
Hgb urine dipstick: NEGATIVE
Ketones, ur: NEGATIVE mg/dL
Nitrite: NEGATIVE
Protein, ur: NEGATIVE mg/dL
Specific Gravity, Urine: 1.009 (ref 1.005–1.030)
pH: 6 (ref 5.0–8.0)

## 2020-12-15 LAB — TRIGLYCERIDES: Triglycerides: 69 mg/dL (ref <150)

## 2020-12-15 LAB — COMPREHENSIVE METABOLIC PANEL
ALT: 14 U/L (ref 0–44)
AST: 22 U/L (ref 15–41)
Albumin: 3.2 g/dL — ABNORMAL LOW (ref 3.5–5.0)
Alkaline Phosphatase: 88 U/L (ref 38–126)
Anion gap: 12 (ref 5–15)
BUN: 18 mg/dL (ref 8–23)
CO2: 24 mmol/L (ref 22–32)
Calcium: 8.9 mg/dL (ref 8.9–10.3)
Chloride: 101 mmol/L (ref 98–111)
Creatinine, Ser: 1.75 mg/dL — ABNORMAL HIGH (ref 0.44–1.00)
GFR, Estimated: 27 mL/min — ABNORMAL LOW (ref >60)
Glucose, Bld: 114 mg/dL — ABNORMAL HIGH (ref 70–99)
Potassium: 3.4 mmol/L — ABNORMAL LOW (ref 3.5–5.1)
Sodium: 137 mmol/L (ref 135–145)
Total Bilirubin: 1 mg/dL (ref 0.3–1.2)
Total Protein: 6.3 g/dL — ABNORMAL LOW (ref 6.5–8.1)

## 2020-12-15 LAB — LACTATE DEHYDROGENASE: LDH: 287 U/L — ABNORMAL HIGH (ref 98–192)

## 2020-12-15 LAB — RESP PANEL BY RT-PCR (FLU A&B, COVID) ARPGX2
Influenza A by PCR: NEGATIVE
Influenza B by PCR: NEGATIVE
SARS Coronavirus 2 by RT PCR: POSITIVE — AB

## 2020-12-15 LAB — FERRITIN: Ferritin: 139 ng/mL (ref 11–307)

## 2020-12-15 LAB — FIBRINOGEN: Fibrinogen: 468 mg/dL (ref 210–475)

## 2020-12-15 LAB — PROCALCITONIN: Procalcitonin: <0.1 ng/mL

## 2020-12-15 LAB — D-DIMER, QUANTITATIVE: D-Dimer, Quant: 0.72 ug/mL-FEU — ABNORMAL HIGH (ref 0.00–0.50)

## 2020-12-15 LAB — C-REACTIVE PROTEIN: CRP: 0.7 mg/dL (ref <1.0)

## 2020-12-15 MED ORDER — RIVAROXABAN 15 MG PO TABS
15.0000 mg | ORAL_TABLET | Freq: Every day | ORAL | Status: DC
  Administered 2020-12-16 – 2020-12-20 (×5): 15 mg via ORAL
  Filled 2020-12-15 (×6): qty 1

## 2020-12-15 MED ORDER — SODIUM CHLORIDE 0.9 % IV SOLN
200.0000 mg | Freq: Once | INTRAVENOUS | Status: AC
  Administered 2020-12-15: 200 mg via INTRAVENOUS
  Filled 2020-12-15: qty 40

## 2020-12-15 MED ORDER — ALBUTEROL SULFATE HFA 108 (90 BASE) MCG/ACT IN AERS
2.0000 | INHALATION_SPRAY | RESPIRATORY_TRACT | Status: DC | PRN
  Filled 2020-12-15: qty 6.7

## 2020-12-15 MED ORDER — POTASSIUM CHLORIDE CRYS ER 10 MEQ PO TBCR
10.0000 meq | EXTENDED_RELEASE_TABLET | Freq: Every day | ORAL | Status: DC
  Administered 2020-12-16 – 2020-12-18 (×3): 10 meq via ORAL
  Filled 2020-12-15 (×4): qty 1

## 2020-12-15 MED ORDER — HYDROCHLOROTHIAZIDE 25 MG PO TABS: 25.0000 mg | ORAL_TABLET | Freq: Every day | ORAL | Status: DC

## 2020-12-15 MED ORDER — OXYCODONE-ACETAMINOPHEN 5-325 MG PO TABS: 1.0000 | ORAL_TABLET | Freq: Three times a day (TID) | ORAL | Status: DC

## 2020-12-15 MED ORDER — BUMETANIDE 2 MG PO TABS
2.0000 mg | ORAL_TABLET | Freq: Every day | ORAL | Status: DC
  Administered 2020-12-16: 2 mg via ORAL
  Filled 2020-12-15: qty 1

## 2020-12-15 MED ORDER — GUAIFENESIN-DM 100-10 MG/5ML PO SYRP: 10.0000 mL | ORAL_SOLUTION | ORAL | Status: DC | PRN

## 2020-12-15 MED ORDER — SODIUM CHLORIDE 0.9 % IV SOLN
100.0000 mg | Freq: Every day | INTRAVENOUS | Status: AC
  Administered 2020-12-16 – 2020-12-19 (×4): 100 mg via INTRAVENOUS
  Filled 2020-12-15: qty 100
  Filled 2020-12-15 (×4): qty 20

## 2020-12-15 MED ORDER — ACETAMINOPHEN 325 MG PO TABS
650.0000 mg | ORAL_TABLET | Freq: Four times a day (QID) | ORAL | Status: DC | PRN
  Filled 2020-12-15: qty 2

## 2020-12-15 MED ORDER — POTASSIUM CHLORIDE CRYS ER 20 MEQ PO TBCR
20.0000 meq | EXTENDED_RELEASE_TABLET | Freq: Once | ORAL | Status: AC
  Administered 2020-12-15: 20 meq via ORAL
  Filled 2020-12-15: qty 1

## 2020-12-15 MED ORDER — DILTIAZEM HCL ER COATED BEADS 180 MG PO CP24
180.0000 mg | ORAL_CAPSULE | Freq: Every day | ORAL | Status: DC
  Administered 2020-12-16: 180 mg via ORAL
  Filled 2020-12-15 (×2): qty 1

## 2020-12-15 MED ORDER — DULOXETINE HCL 20 MG PO CPEP
20.0000 mg | ORAL_CAPSULE | Freq: Every day | ORAL | Status: DC
  Administered 2020-12-16 – 2020-12-19 (×4): 20 mg via ORAL
  Filled 2020-12-15 (×5): qty 1

## 2020-12-15 MED ORDER — SODIUM CHLORIDE 0.9 % IV BOLUS
1000.0000 mL | Freq: Once | INTRAVENOUS | Status: AC
  Administered 2020-12-15: 1000 mL via INTRAVENOUS

## 2020-12-15 MED ORDER — SODIUM CHLORIDE 0.9 % IV SOLN: 100.0000 mg | Freq: Every day | INTRAVENOUS | Status: DC

## 2020-12-15 MED ORDER — SENNOSIDES-DOCUSATE SODIUM 8.6-50 MG PO TABS: 1.0000 | ORAL_TABLET | Freq: Every evening | ORAL | Status: DC | PRN

## 2020-12-15 MED ORDER — LACTATED RINGERS IV SOLN: INTRAVENOUS | Status: DC

## 2020-12-15 MED ORDER — SODIUM CHLORIDE 0.9 % IV SOLN: 200.0000 mg | Freq: Once | INTRAVENOUS | Status: DC

## 2020-12-15 MED ORDER — IRBESARTAN 300 MG PO TABS: 150.0000 mg | ORAL_TABLET | Freq: Every day | ORAL | Status: DC

## 2020-12-15 NOTE — H&P (Signed)
History and Physical    Tracey Allen HGD:[id number] DOB: Mar 06, 1925 DOA: 12/15/2020  PCP: Townsend Roger, MD   Patient coming from: Home  Chief Complaint: Fusion  HPI: Tracey Allen is a 87 y.o. female with medical history significant for hypertension, atrial fibrillation, CHF, CKD stage III, recent fall with pelvic fracture.  She was diagnosed with Covid a week ago but was not treated with outpatient clinical antibodies.  She presents by EMS with complaint of confusion.  Patient had a fall recently and had pelvic fracture according to the daughter who I spoke to on the phone.  Patient has been able to ambulate with a walker and has been having home health physical therapy for rehab.  Today physical therapy came out and could not do rehab as patient was confused and altered from her baseline level.  Daughter states that she did find her 2 days ago wandering around outside in the yard which is not like her.  Daughter does not feel that she can take care of her at home and she may need inpatient rehab at this point.  She is not had any cough or shortness of breath.  She has not had any fever, nausea, vomiting or diarrhea.  She is on Xarelto for anticoagulation with her A. Fib.  She did not have any loss of consciousness with her fall.  She does not complain of any headache or neck pain.  CT of the head was negative in the emergency room.  Hospitalist service was asked to admit for further management with her Covid and weakness and confusion.  Review of Systems:  Accurate review of system cannot be obtained secondary to confusion.  Patient denies chest pain shortness of breath, abdominal pain, nausea vomiting or diarrhea  Past Medical History:  Diagnosis Date  . Arthritis   . Chronic anticoagulation 11/12/2015  . Chronic atrial fibrillation (HCC) 11/12/2015   CHADS2Vas Score Tracey Allen Score:  4  Tracey Allen annual stroke risk:  4%  . Chronic kidney disease   . Chronic pain of  both knees 03/09/2017  . Congestive heart failure (CHF) ([place]) 07/27/2018  . GERD (gastroesophageal reflux disease)   . High risk medication use 07/28/2017   digoxin  . Hypertension   . Hypertensive heart disease with heart failure ([place]) 07/01/2017  . Injury of right knee 05/18/2017  . Neuropathy   . Onychomycosis due to dermatophyte 11/11/2016  . Pain due to onychomycosis of toenails of both feet 10/04/2019  . Pain in both lower legs 05/05/2016  . Persistent atrial fibrillation ([place]) 11/12/2015  . Sinus bradycardia 11/12/2015  . SSS (sick sinus syndrome) ([place]) 08/19/2018   She had previous syncope with PAF and a beta blocker  . Stomach ulcer   . Urination, excessive at night     Past Surgical History:  Procedure Laterality Date  . CATARACT EXTRACTION     bilateral  . OVARIAN CYST REMOVAL    . TOTAL KNEE ARTHROPLASTY  12/08/2011   Procedure: TOTAL KNEE ARTHROPLASTY;  Surgeon: Rudean Haskell, MD;  Location: [place];  Service: Orthopedics;  Laterality: Left;  Total Knee Arthroplasty Left     Social History  reports that she has quit smoking. Her smoking use included cigarettes. She has never used smokeless tobacco. She reports that she does not drink alcohol and does not use drugs.  Allergies  Allergen Reactions  . Amoxicillin Other (See Comments)    Blisters   . Norvasc [Amlodipine Besylate] Other (  See Comments)    Unknown reaction; pt currently taking AZOR  . Tape Other (See Comments)    SKIN IS VERY THIN!!  . Beta Adrenergic Blockers Rash  . Codeine Rash  . Penicillins Rash    Family History  Problem Relation Age of Onset  . Hypertension Mother   . Heart attack Father   . Cervical cancer Sister   . Heart attack Brother      Prior to Admission medications   Medication Sig Start Date End Date Taking? Authorizing Provider  bumetanide (BUMEX) 2 MG tablet Take 2 mg by mouth in the morning.   Yes [provider]  Calcium Carb-Cholecalciferol (CALCIUM+D3 PO) Take 1  tablet by mouth daily with breakfast.   Yes [provider]  Cyanocobalamin 1000 MCG/ML KIT Inject 1,000 mcg as directed every 30 (thirty) days.   Yes [provider]  diltiazem (CARDIZEM CD) 180 MG 24 hr capsule TAKE ONE CAPSULE BY MOUTH DAILY Patient taking differently: Take 180 mg by mouth in the morning. 09/20/20  Yes Richardo Priest, MD  DULoxetine (CYMBALTA) 20 MG capsule Take 20 mg by mouth daily.  10/30/17  Yes [provider]  Multiple Vitamins-Minerals (PRESERVISION AREDS) CAPS Take 1 capsule by mouth 2 (two) times daily.   Yes [provider]  olmesartan (BENICAR) 20 MG tablet TAKE ONE TABLET BY MOUTH DAILY Patient taking differently: Take 20 mg by mouth daily. 07/11/20 08/10/20 Yes Richardo Priest, MD  oxyCODONE-acetaminophen (PERCOCET/ROXICET) 5-325 MG tablet Take 1 tablet by mouth 3 (three) times daily.   Yes [provider]  potassium chloride (KLOR-CON) 10 MEQ tablet TAKE ONE TABLET BY MOUTH DAILY Patient taking differently: Take 10 mEq by mouth daily. 09/20/20  Yes Richardo Priest, MD  sulfamethoxazole-trimethoprim (BACTRIM DS) 800-160 MG tablet Take 1 tablet by mouth 2 (two) times daily.   Yes [provider]  XARELTO 15 MG TABS tablet TAKE ONE TABLET BY MOUTH DAILY Patient taking differently: Take 15 mg by mouth in the morning. 09/19/20  Yes Richardo Priest, MD  DILT-XR 120 MG 24 hr capsule TAKE ONE CAPSULE BY MOUTH DAILY Patient not taking: Reported on 12/15/2020 07/11/20 08/10/20  Richardo Priest, MD  hydrochlorothiazide (HYDRODIURIL) 25 MG tablet TAKE ONE TABLET BY MOUTH DAILY 01/16/20 02/15/20  Richardo Priest, MD  omeprazole (PRILOSEC) 20 MG capsule TAKE ONE CAPSULE BY MOUTH DAILY and TAKE ONE CAPSULE BY MOUTH DAILY AT 8 PM 10/22/20   Richardo Priest, MD    Physical Exam: Vitals:   12/15/20 1900 12/15/20 1945 12/15/20 2000 12/15/20 2045  BP: (!) 144/57 133/74 (!) 143/73 136/67  Pulse: 81 88 (!) 53 89  Resp: _0 (!) 21   Temp:      TempSrc:      SpO2: 96% 96% 96% 96%  Weight:      Height:        Constitutional: NAD, calm, comfortable Vitals:   12/15/20 1900 12/15/20 1945 12/15/20 2000 12/15/20 2045  BP: (!) 144/57 133/74 (!) 143/73 136/67  Pulse: 81 88 (!) 53 89  Resp: _1 (!) 21  Temp:      TempSrc:      SpO2: 96% 96% 96% 96%  Weight:      Height:       General: WDWN, Alert and oriented to self.  Eyes: EOMI, PERRL, conjunctivae normal.  Sclera nonicteric HENT:  [place]/AT, external ears normal.  Nares patent without epistasis.  Mucous membranes are  moist.  Neck: Soft, normal range of motion, supple, no masses.  Trachea midline Respiratory: clear to auscultation bilaterally, no wheezing, no crackles. Normal respiratory effort. No accessory muscle use.  Cardiovascular: Irregularly irregular rhythm with normal rate, no murmurs / rubs / gallops. No extremity edema. 2+ pedal pulses.  Abdomen: Soft, no tenderness, nondistended, no rebound or guarding.  No masses palpated. Bowel sounds normoactive Musculoskeletal: FROM. no cyanosis. No joint deformity upper and lower extremities. Normal muscle tone.  Skin: Warm, dry, intact no rashes, lesions, ulcers. No induration Neurologic: CN 2-12 grossly intact.  Normal speech.  Sensation intact, patella DTR +1 bilaterally. Strength 5/5 in all extremities.      Labs on Admission: I have personally reviewed following labs and imaging studies  CBC: Recent Labs  Lab 12/15/20 1242  WBC 7.0  HGB 10.9*  HCT 34.0*  MCV 93.9  PLT 694    Basic Metabolic Panel: Recent Labs  Lab 12/15/20 1242  NA 137  K 3.4*  CL 101  CO2 24  GLUCOSE 114*  BUN 18  CREATININE 1.75*  CALCIUM 8.9    GFR: Estimated Creatinine Clearance: 18.2 mL/min (A) (by C-G formula based on SCr of 1.75 mg/dL (H)).  Liver Function Tests: Recent Labs  Lab 12/15/20 1242  AST 22  ALT 14  ALKPHOS 88  BILITOT 1.0  PROT 6.3*  ALBUMIN 3.2*    Urine analysis:    Component Value  Date/Time   COLORURINE YELLOW 12/15/2020 1836   APPEARANCEUR CLEAR 12/15/2020 1836   LABSPEC 1.009 12/15/2020 1836   PHURINE 6.0 12/15/2020 1836   GLUCOSEU NEGATIVE 12/15/2020 1836   HGBUR NEGATIVE 12/15/2020 1836   BILIRUBINUR NEGATIVE 12/15/2020 1836   KETONESUR NEGATIVE 12/15/2020 1836   PROTEINUR NEGATIVE 12/15/2020 1836   UROBILINOGEN 0.2 12/01/2011 1046   NITRITE NEGATIVE 12/15/2020 1836   LEUKOCYTESUR SMALL (A) 12/15/2020 1836    Radiological Exams on Admission: CT HEAD WO CONTRAST  Result Date: 12/15/2020 CLINICAL DATA:  Mental status changes EXAM: CT HEAD WITHOUT CONTRAST TECHNIQUE: Contiguous axial images were obtained from the base of the skull through the vertex without intravenous contrast. COMPARISON:  11/15/2020 FINDINGS: Brain: There is atrophy and chronic small vessel disease changes. No acute intracranial abnormality. Specifically, no hemorrhage, hydrocephalus, mass lesion, acute infarction, or significant intracranial injury. Vascular: No hyperdense vessel or unexpected calcification. Skull: No acute calvarial abnormality. Sinuses/Orbits: No acute findings Other: None IMPRESSION: Atrophy, chronic microvascular disease. No acute intracranial abnormality. Electronically Signed   By: Rolm Baptise M.D.   On: 12/15/2020 14:13   DG Chest Port 1 View  Result Date: 12/15/2020 CLINICAL DATA:  Cough, COVID EXAM: PORTABLE CHEST 1 VIEW COMPARISON:  07/02/2020 FINDINGS: Cardiomegaly. Patchy bilateral airspace opacities. Diffuse interstitial prominence, likely underlying chronic lung disease. No effusions or pneumothorax. IMPRESSION: Patchy bilateral airspace opacities superimposed on chronic lung disease. Findings concerning for multifocal pneumonia. Cardiomegaly. Electronically Signed   By: Rolm Baptise M.D.   On: 12/15/2020 14:10    EKG: Independently reviewed.  Atrial fibrillation with heart rate of 96.  No acute ST elevation or depression.  QTc 407  Assessment/Plan Principal  Problem:   Pneumonia due to COVID-19 virus Ms. Tailor was diagnosised with COVID a week ago at outside facility.  Unfortunately we do not have a copy of positive test so Covid test will be repeated.  If positive will initiate remdesivir therapy.  Is not requiring oxygen at this time so we will not start steroids. Incentive spirometer every 1-2 hours while  awake. Antitussives provided and supportive care provided Gentle IV fluid hydration with LR at 50 ml/hr  Active Problems:   Chronic atrial fibrillation  Chronic and stable A. fib.  Patient is on Cardizem which will be continued.  Continue anticoagulation with Xarelto     Essential hypertension Continue HCTZ and Benicar.  Monitor blood pressure    Chronic anticoagulation Is on Xarelto.    Weakness Consult physical therapy for evaluation.  Consult social work for evaluation and assistance with obtaining rehab facility bed.  Daughter states that can no longer take care of patient at home and she will need more extensive rehab.  She had a recent fall and had pelvic fractures that she was getting home health physical therapy for her does not feel this is adequate    DVT prophylaxis: Is anticoagulated on Xarelto which will be continued Code Status:   Full code Family Communication:  Diagnosis and plan discussed with patient.  Also discussed with her daughter over the phone.  She is answered.  Further recommendation follow as clinically indicated Disposition Plan:   Patient is from:  Home  Anticipated DC to:  SNF for rehab  Anticipated DC date:  Anticipate greater than 2 midnight stay in the hospital  Anticipated DC barriers: Physical therapy evaluation and social work evaluation for rehab placement      per family's request  Admission status:  Inpatient  Yevonne Aline  MD [facility]  How to contact the [facility] Attending or Consulting provider [place] or covering provider during after hours [place], for this patient?    1. Check the care team in [facility] and look for a) attending/consulting [facility] provider listed and b) the [facility] team listed 2. Log into [email or web address] and use [place]'s universal password to access. If you do not have the password, please contact the hospital operator. 3. Locate the [facility] provider you are looking for under [facility] and page to a number that you can be directly reached. 4. If you still have difficulty reaching the provider, please page the [facility] (Director on Call) for the Hospitalists listed on amion for assistance.  12/15/2020, 9:42 PM

## 2020-12-15 NOTE — ED Notes (Signed)
Patient provided with bagged lunch and water and encouraged to eat. Patient eating independently at this time. Will continue to monitor.

## 2020-12-15 NOTE — ED Notes (Signed)
Patient transported to CT via stretcher in stable condition 

## 2020-12-15 NOTE — ED Triage Notes (Signed)
Pts family called MEDIC from home stating pt was "altered". Pt Covid positive X 1 week. Pt in Afib with hx of same

## 2020-12-15 NOTE — ED Notes (Signed)
Butch Penny (daughter) can be reached at [phone].

## 2020-12-15 NOTE — ED Provider Notes (Addendum)
[facility] EMERGENCY DEPARTMENT Provider Note   CSN: [id number] Arrival date & time:        History Chief Complaint  Patient presents with  . Altered Mental Status    Family states patient is "altered", MEDIC does not agree    Tracey Allen is a 87 y.o. female.  Patient with recent diagnosis covid, presents via EMS from home. Per report, physical therapist felt pts mental status was changed from a week ago, that patient seemed confused, and packed a bag and was wandering in yard. Pt very poor historian - level 5 caveat. Pt notes a fall earlier in month, unclear if any new or recent fall. Denies headache. No neck or back pain. No chest pain or sob. No abd pain or nvd. No dysuria or gu c/o. Report of positive covid test 1 week ago.   The history is provided by the patient and the EMS personnel. The history is limited by the condition of the patient.  Altered Mental Status Presenting symptoms: confusion   Associated symptoms: no abdominal pain, no fever, no headaches, no rash and no vomiting        Past Medical History:  Diagnosis Date  . Arthritis   . Chronic anticoagulation 11/12/2015  . Chronic atrial fibrillation (HCC) 11/12/2015   CHADS2Vas Score Doneta K Roes Score:  4  Ariany K Charnley annual stroke risk:  4%  . Chronic pain of both knees 03/09/2017  . Congestive heart failure (CHF) ([place]) 07/27/2018  . GERD (gastroesophageal reflux disease)   . High risk medication use 07/28/2017   digoxin  . Hypertension   . Hypertensive heart disease with heart failure ([place]) 07/01/2017  . Injury of right knee 05/18/2017  . Neuropathy   . Onychomycosis due to dermatophyte 11/11/2016  . Pain due to onychomycosis of toenails of both feet 10/04/2019  . Pain in both lower legs 05/05/2016  . Persistent atrial fibrillation ([place]) 11/12/2015  . Sinus bradycardia 11/12/2015  . SSS (sick sinus syndrome) ([place]) 08/19/2018   She had previous syncope with PAF and a beta blocker   . Stomach ulcer   . Urination, excessive at night     Patient Active Problem List   Diagnosis Date Noted  . Pain due to onychomycosis of toenails of both feet 10/04/2019  . SSS (sick sinus syndrome) ([place]) 08/19/2018  . Congestive heart failure (CHF) ([place]) 07/27/2018  . High risk medication use 07/28/2017  . Hypertensive heart disease with heart failure ([place]) 07/01/2017  . Injury of right knee 05/18/2017  . Chronic pain of both knees 03/09/2017  . Onychomycosis due to dermatophyte 11/11/2016  . Pain in both lower legs 05/05/2016  . Chronic anticoagulation 11/12/2015  . Chronic atrial fibrillation ([place]) 11/12/2015    Past Surgical History:  Procedure Laterality Date  . CATARACT EXTRACTION     bilateral  . OVARIAN CYST REMOVAL    . TOTAL KNEE ARTHROPLASTY  12/08/2011   Procedure: TOTAL KNEE ARTHROPLASTY;  Surgeon: Rudean Haskell, MD;  Location: [facility];  Service: Orthopedics;  Laterality: Left;  Total Knee Arthroplasty Left      OB History   No obstetric history on file.     Family History  Problem Relation Age of Onset  . Hypertension Mother   . Heart attack Father   . Cervical cancer Sister   . Heart attack Brother     Social History   Tobacco Use  . Smoking status: Former Smoker    Types: Cigarettes  .  Smokeless tobacco: Never Used  Vaping Use  . Vaping Use: Never used  Substance Use Topics  . Alcohol use: No  . Drug use: No    Home Medications Prior to Admission medications   Medication Sig Start Date End Date Taking? Authorizing Provider  Calcium-Magnesium-Vitamin D (CALCIUM 500 PO) Take 1,000 mg by mouth daily.    [provider]  Cholecalciferol (VITAMIN D PO) Take 1 tablet by mouth daily.    [provider]  DILT-XR 120 MG 24 hr capsule TAKE ONE CAPSULE BY MOUTH DAILY 07/11/20 08/10/20  Richardo Priest, MD  diltiazem (CARDIZEM CD) 180 MG 24 hr capsule TAKE ONE CAPSULE BY MOUTH DAILY 09/20/20   Richardo Priest, MD  DULoxetine (CYMBALTA)  20 MG capsule Take 20 mg by mouth daily.  10/30/17   [provider]  hydrochlorothiazide (HYDRODIURIL) 25 MG tablet TAKE ONE TABLET BY MOUTH DAILY 01/16/20 02/15/20  Richardo Priest, MD  olmesartan (BENICAR) 20 MG tablet TAKE ONE TABLET BY MOUTH DAILY 07/11/20 08/10/20  Richardo Priest, MD  omeprazole (PRILOSEC) 20 MG capsule TAKE ONE CAPSULE BY MOUTH DAILY and TAKE ONE CAPSULE BY MOUTH DAILY AT 8 PM 10/22/20   Munley, Hilton Cork, MD  potassium chloride (KLOR-CON) 10 MEQ tablet TAKE ONE TABLET BY MOUTH DAILY 09/20/20   Richardo Priest, MD  XARELTO 15 MG TABS tablet TAKE ONE TABLET BY MOUTH DAILY 09/19/20   Richardo Priest, MD    Allergies    Amoxicillin, Codeine, Norvasc [amlodipine besylate], Other, and Penicillins  Review of Systems   Review of Systems  Constitutional: Negative for fever.  HENT: Negative for sore throat.   Eyes: Negative for redness and visual disturbance.  Respiratory: Positive for cough. Negative for shortness of breath.   Cardiovascular: Negative for chest pain.  Gastrointestinal: Negative for abdominal pain and vomiting.  Genitourinary: Negative for flank pain.  Musculoskeletal: Negative for back pain and neck pain.  Skin: Negative for rash.  Neurological: Negative for speech difficulty, numbness and headaches.  Hematological: Does not bruise/bleed easily.       On xarelto.  Psychiatric/Behavioral: Positive for confusion.    Physical Exam Updated Vital Signs There were no vitals taken for this visit.  Physical Exam Vitals and nursing note reviewed.  Constitutional:      Appearance: Normal appearance. She is well-developed.  HENT:     Head: Atraumatic.     Nose: Nose normal.     Mouth/Throat:     Mouth: Mucous membranes are moist.  Eyes:     General: No scleral icterus.    Conjunctiva/sclera: Conjunctivae normal.     Pupils: Pupils are equal, round, and reactive to light.  Neck:     Vascular: No carotid bruit.     Trachea: No tracheal deviation.   Cardiovascular:     Rate and Rhythm: Normal rate and regular rhythm.     Pulses: Normal pulses.     Heart sounds: Normal heart sounds. No murmur heard. No friction rub. No gallop.   Pulmonary:     Effort: Pulmonary effort is normal. No respiratory distress.     Breath sounds: Normal breath sounds.  Abdominal:     General: Bowel sounds are normal. There is no distension.     Palpations: Abdomen is soft.     Tenderness: There is no abdominal tenderness. There is no guarding.  Genitourinary:    Comments: No cva tenderness.  Musculoskeletal:        General: No  swelling.     Cervical back: Normal range of motion and neck supple. No rigidity. No muscular tenderness.  Skin:    General: Skin is warm and dry.     Findings: No rash.  Neurological:     Mental Status: She is alert.     Comments: Alert, speech normal, without gross aphasia or dysarthria. Motor intact bil, stre 5/5. Sensation grossly intact. +confusion, recent events, insight into current symptoms and being in ED.   Psychiatric:        Mood and Affect: Mood normal.     ED Results / Procedures / Treatments   Labs (all labs ordered are listed, but only abnormal results are displayed) Results for orders placed or performed during the hospital encounter of 12/15/20  CBC  Result Value Ref Range   WBC 7.0 4.0 - 10.5 K/uL   RBC 3.62 (L) 3.87 - 5.11 MIL/uL   Hemoglobin 10.9 (L) 12.0 - 15.0 g/dL   HCT 34.0 (L) 36.0 - 46.0 %   MCV 93.9 80.0 - 100.0 fL   MCH 30.1 26.0 - 34.0 pg   MCHC 32.1 30.0 - 36.0 g/dL   RDW 12.3 11.5 - 15.5 %   Platelets 303 150 - 400 K/uL   nRBC 0.0 0.0 - 0.2 %  Comprehensive metabolic panel  Result Value Ref Range   Sodium 137 135 - 145 mmol/L   Potassium 3.4 (L) 3.5 - 5.1 mmol/L   Chloride 101 98 - 111 mmol/L   CO2 24 22 - 32 mmol/L   Glucose, Bld 114 (H) 70 - 99 mg/dL   BUN 18 8 - 23 mg/dL   Creatinine, Ser 1.75 (H) 0.44 - 1.00 mg/dL   Calcium 8.9 8.9 - 10.3 mg/dL   Total Protein 6.3 (L) 6.5 -  8.1 g/dL   Albumin 3.2 (L) 3.5 - 5.0 g/dL   AST 22 15 - 41 U/L   ALT 14 0 - 44 U/L   Alkaline Phosphatase 88 38 - 126 U/L   Total Bilirubin 1.0 0.3 - 1.2 mg/dL   GFR, Estimated 27 (L) >60 mL/min   Anion gap 12 5 - 15  Urinalysis, Routine w reflex microscopic Urine, Clean Catch  Result Value Ref Range   Color, Urine YELLOW YELLOW   APPearance CLEAR CLEAR   Specific Gravity, Urine 1.009 1.005 - 1.030   pH 6.0 5.0 - 8.0   Glucose, UA NEGATIVE NEGATIVE mg/dL   Hgb urine dipstick NEGATIVE NEGATIVE   Bilirubin Urine NEGATIVE NEGATIVE   Ketones, ur NEGATIVE NEGATIVE mg/dL   Protein, ur NEGATIVE NEGATIVE mg/dL   Nitrite NEGATIVE NEGATIVE   Leukocytes,Ua SMALL (A) NEGATIVE   RBC / HPF 0-5 0 - 5 RBC/hpf   WBC, UA 0-5 0 - 5 WBC/hpf   Bacteria, UA RARE (A) NONE SEEN   Squamous Epithelial / LPF 0-5 0 - 5   Mucus PRESENT    Hyaline Casts, UA PRESENT    CT HEAD WO CONTRAST  Result Date: 12/15/2020 CLINICAL DATA:  Mental status changes EXAM: CT HEAD WITHOUT CONTRAST TECHNIQUE: Contiguous axial images were obtained from the base of the skull through the vertex without intravenous contrast. COMPARISON:  11/15/2020 FINDINGS: Brain: There is atrophy and chronic small vessel disease changes. No acute intracranial abnormality. Specifically, no hemorrhage, hydrocephalus, mass lesion, acute infarction, or significant intracranial injury. Vascular: No hyperdense vessel or unexpected calcification. Skull: No acute calvarial abnormality. Sinuses/Orbits: No acute findings Other: None IMPRESSION: Atrophy, chronic microvascular disease. No acute intracranial abnormality.  Electronically Signed   By: Rolm Baptise M.D.   On: 12/15/2020 14:13   DG Chest Port 1 View  Result Date: 12/15/2020 CLINICAL DATA:  Cough, COVID EXAM: PORTABLE CHEST 1 VIEW COMPARISON:  07/02/2020 FINDINGS: Cardiomegaly. Patchy bilateral airspace opacities. Diffuse interstitial prominence, likely underlying chronic lung disease. No effusions or  pneumothorax. IMPRESSION: Patchy bilateral airspace opacities superimposed on chronic lung disease. Findings concerning for multifocal pneumonia. Cardiomegaly. Electronically Signed   By: Rolm Baptise M.D.   On: 12/15/2020 14:10    EKG EKG Interpretation  Date/Time:  Saturday December 15 2020 12:33:08 EST Ventricular Rate:  96 PR Interval:    QRS Duration: 85 QT Interval:  364 QTC Calculation: 407 R Axis:   14 Text Interpretation: Atrial fibrillation Nonspecific T wave abnormality Confirmed by Lajean Saver [phone]) on 12/15/2020 1:05:13 PM   Radiology CT HEAD WO CONTRAST  Result Date: 12/15/2020 CLINICAL DATA:  Mental status changes EXAM: CT HEAD WITHOUT CONTRAST TECHNIQUE: Contiguous axial images were obtained from the base of the skull through the vertex without intravenous contrast. COMPARISON:  11/15/2020 FINDINGS: Brain: There is atrophy and chronic small vessel disease changes. No acute intracranial abnormality. Specifically, no hemorrhage, hydrocephalus, mass lesion, acute infarction, or significant intracranial injury. Vascular: No hyperdense vessel or unexpected calcification. Skull: No acute calvarial abnormality. Sinuses/Orbits: No acute findings Other: None IMPRESSION: Atrophy, chronic microvascular disease. No acute intracranial abnormality. Electronically Signed   By: Rolm Baptise M.D.   On: 12/15/2020 14:13   DG Chest Port 1 View  Result Date: 12/15/2020 CLINICAL DATA:  Cough, COVID EXAM: PORTABLE CHEST 1 VIEW COMPARISON:  07/02/2020 FINDINGS: Cardiomegaly. Patchy bilateral airspace opacities. Diffuse interstitial prominence, likely underlying chronic lung disease. No effusions or pneumothorax. IMPRESSION: Patchy bilateral airspace opacities superimposed on chronic lung disease. Findings concerning for multifocal pneumonia. Cardiomegaly. Electronically Signed   By: Rolm Baptise M.D.   On: 12/15/2020 14:10    Procedures Procedures (including critical care time)  Medications  Ordered in ED Medications - No data to display  ED Course  I have reviewed the triage vital signs and the nursing notes.  Pertinent labs & imaging results that were available during my care of the patient were reviewed by me and considered in my medical decision making (see chart for details).    MDM Rules/Calculators/A&P                         Lab sent. Imaging ordered.  KENYAH LUBA was evaluated in Emergency Department on 12/15/2020 for the symptoms described in the history of present illness. She was evaluated in the context of the global COVID-19 pandemic, which necessitated consideration that the patient might be at risk for infection with the SARS-CoV-2 virus that causes COVID-19. Institutional protocols and algorithms that pertain to the evaluation of patients at risk for COVID-19 are in a state of rapid change based on information released by regulatory bodies including the CDC and federal and state organizations. These policies and algorithms were followed during the patient's care in the ED.  Reviewed nursing notes and prior charts for additional history.   CXR reviewed/interpreted by me - c/w covid.  Labs reviewed/interpreted by me - mild aki compared to prior. Iv ns bolus. Po fluids/food. kcl po.   UA neg for infection.   Given AKI, altered ms/weakness, will plan for admission.  Hospitalists consulted.    Final Clinical Impression(s) / ED Diagnoses Final diagnoses:  None    Rx /  DC Orders ED Discharge Orders    None        Lajean Saver, MD 12/15/20 2028

## 2020-12-16 ENCOUNTER — Other Ambulatory Visit: Payer: Self-pay

## 2020-12-16 DIAGNOSIS — J1282 Pneumonia due to coronavirus disease 2019: Secondary | ICD-10-CM | POA: Diagnosis not present

## 2020-12-16 DIAGNOSIS — U071 COVID-19: Secondary | ICD-10-CM | POA: Diagnosis not present

## 2020-12-16 LAB — BASIC METABOLIC PANEL
Anion gap: 13 (ref 5–15)
BUN: 17 mg/dL (ref 8–23)
CO2: 22 mmol/L (ref 22–32)
Calcium: 8.9 mg/dL (ref 8.9–10.3)
Chloride: 104 mmol/L (ref 98–111)
Creatinine, Ser: 1.56 mg/dL — ABNORMAL HIGH (ref 0.44–1.00)
GFR, Estimated: 31 mL/min — ABNORMAL LOW (ref >60)
Glucose, Bld: 94 mg/dL (ref 70–99)
Potassium: 3.7 mmol/L (ref 3.5–5.1)
Sodium: 139 mmol/L (ref 135–145)

## 2020-12-16 LAB — C-REACTIVE PROTEIN
CRP: <0.5 mg/dL (ref <1.0)
CRP: 0.6 mg/dL (ref <1.0)
CRP: 0.7 mg/dL (ref <1.0)

## 2020-12-16 LAB — CBC WITH DIFFERENTIAL/PLATELET
Abs Immature Granulocytes: 0.01 10*3/uL (ref 0.00–0.07)
Basophils Absolute: 0 10*3/uL (ref 0.0–0.1)
Basophils Relative: 0 %
Eosinophils Absolute: 0.1 10*3/uL (ref 0.0–0.5)
Eosinophils Relative: 2 %
HCT: 31.5 % — ABNORMAL LOW (ref 36.0–46.0)
Hemoglobin: 10.6 g/dL — ABNORMAL LOW (ref 12.0–15.0)
Immature Granulocytes: 0 %
Lymphocytes Relative: 34 %
Lymphs Abs: 2 10*3/uL (ref 0.7–4.0)
MCH: 31.6 pg (ref 26.0–34.0)
MCHC: 33.7 g/dL (ref 30.0–36.0)
MCV: 94 fL (ref 80.0–100.0)
Monocytes Absolute: 0.5 10*3/uL (ref 0.1–1.0)
Monocytes Relative: 9 %
Neutro Abs: 3.3 10*3/uL (ref 1.7–7.7)
Neutrophils Relative %: 55 %
Platelets: 286 10*3/uL (ref 150–400)
RBC: 3.35 MIL/uL — ABNORMAL LOW (ref 3.87–5.11)
RDW: 12.4 % (ref 11.5–15.5)
WBC: 6.1 10*3/uL (ref 4.0–10.5)
nRBC: 0 % (ref 0.0–0.2)

## 2020-12-16 LAB — FERRITIN
Ferritin: 114 ng/mL (ref 11–307)
Ferritin: 108 ng/mL (ref 11–307)

## 2020-12-16 LAB — MAGNESIUM: Magnesium: 1.6 mg/dL — ABNORMAL LOW (ref 1.7–2.4)

## 2020-12-16 LAB — BRAIN NATRIURETIC PEPTIDE: B Natriuretic Peptide: 173.1 pg/mL — ABNORMAL HIGH (ref 0.0–100.0)

## 2020-12-16 LAB — D-DIMER, QUANTITATIVE: D-Dimer, Quant: 0.85 ug/mL-FEU — ABNORMAL HIGH (ref 0.00–0.50)

## 2020-12-16 MED ORDER — DILTIAZEM HCL 25 MG/5ML IV SOLN
10.0000 mg | Freq: Four times a day (QID) | INTRAVENOUS | Status: DC | PRN
  Administered 2020-12-16 – 2020-12-17 (×2): 10 mg via INTRAVENOUS
  Filled 2020-12-16 (×2): qty 5

## 2020-12-16 MED ORDER — HALOPERIDOL LACTATE 5 MG/ML IJ SOLN
2.0000 mg | Freq: Once | INTRAMUSCULAR | Status: AC
  Administered 2020-12-16: 2 mg via INTRAVENOUS
  Filled 2020-12-16: qty 1

## 2020-12-16 MED ORDER — LACTATED RINGERS IV SOLN: INTRAVENOUS | Status: DC

## 2020-12-16 MED ORDER — DEXAMETHASONE SODIUM PHOSPHATE 10 MG/ML IJ SOLN
6.0000 mg | INTRAMUSCULAR | Status: DC
  Administered 2020-12-16 – 2020-12-18 (×3): 6 mg via INTRAVENOUS
  Filled 2020-12-16 (×3): qty 1

## 2020-12-16 MED ORDER — OXYCODONE-ACETAMINOPHEN 5-325 MG PO TABS: 1.0000 | ORAL_TABLET | Freq: Three times a day (TID) | ORAL | Status: DC | PRN

## 2020-12-16 MED ORDER — METOPROLOL TARTRATE 5 MG/5ML IV SOLN
5.0000 mg | Freq: Three times a day (TID) | INTRAVENOUS | Status: DC | PRN
  Administered 2020-12-16 – 2020-12-17 (×2): 5 mg via INTRAVENOUS
  Filled 2020-12-16 (×2): qty 5

## 2020-12-16 MED ORDER — DILTIAZEM HCL 90 MG PO TABS
90.0000 mg | ORAL_TABLET | Freq: Two times a day (BID) | ORAL | Status: DC
  Administered 2020-12-16: 90 mg via ORAL
  Filled 2020-12-16 (×2): qty 1

## 2020-12-16 MED ORDER — MAGNESIUM SULFATE 2 GM/50ML IV SOLN
2.0000 g | Freq: Once | INTRAVENOUS | Status: AC
  Administered 2020-12-16: 2 g via INTRAVENOUS
  Filled 2020-12-16: qty 50

## 2020-12-16 MED ORDER — HYDRALAZINE HCL 25 MG PO TABS
50.0000 mg | ORAL_TABLET | Freq: Three times a day (TID) | ORAL | Status: DC
  Administered 2020-12-16: 50 mg via ORAL
  Filled 2020-12-16: qty 1

## 2020-12-16 NOTE — TOC Initial Note (Signed)
Transition of Care [facility]) - Initial/Assessment Note    Patient Details  Name: Tracey Allen MRN: [id number] Date of Birth: [birth year]  [facility] CM/SW Contact:    Verdell Carmine, RN Phone Number: 12/16/2020, 1:55 PM  Clinical Narrative:                 Admitted with AMS, COVID, just healing from pelvic fracture. Has [facility]  Active, Home health - PT  Was noticed as altered by PT worker and daughter, she was wandering in back yard. Positive COVID yesterday.  May need SNF depending on the severity of illness. CM will follow for needs, DME. Will obtain CSW involvement if needed for SNF workup.  Expected Discharge Plan: [place] Barriers to Discharge: Continued Medical Work up   Patient Goals and CMS Choice        Expected Discharge Plan and Services Expected Discharge Plan: [place] In-house Referral: Clinical Social Work Discharge Planning Services: CM Consult Post Acute Care Choice: Home Health (Is active with [facility]) Living arrangements for the past 2 months: Single Family Home                                      Prior Living Arrangements/Services Living arrangements for the past 2 months: Single Family Home Lives with:: Self Patient language and need for interpreter reviewed:: Yes        Need for Family Participation in Patient Care: Yes (Comment) Care giver support system in place?: Yes (comment) Current home services: DME,Homehealth aide,Home PT Criminal Activity/Legal Involvement Pertinent to Current Situation/Hospitalization: No - Comment as needed  Activities of Daily Living      Permission Sought/Granted                  Emotional Assessment       Orientation: : Oriented to Self Alcohol / Substance Use: Not Applicable Psych Involvement: No (comment)  Admission diagnosis:  Pneumonia due to COVID-19 virus [U07.1, J12.82] Patient Active Problem List   Diagnosis Date Noted  .  Pneumonia due to COVID-19 virus 12/15/2020  . Essential hypertension 12/15/2020  . Weakness 12/15/2020  . Pain due to onychomycosis of toenails of both feet 10/04/2019  . SSS (sick sinus syndrome) ([place]) 08/19/2018  . Congestive heart failure (CHF) ([place]) 07/27/2018  . High risk medication use 07/28/2017  . Hypertensive heart disease with heart failure ([place]) 07/01/2017  . Injury of right knee 05/18/2017  . Chronic pain of both knees 03/09/2017  . Onychomycosis due to dermatophyte 11/11/2016  . Pain in both lower legs 05/05/2016  . Chronic anticoagulation 11/12/2015  . Chronic atrial fibrillation (HCC) 11/12/2015   PCP:  Townsend Roger, MD Pharmacy:   [facility] [address] [place] [address] Phone: [phone] Fax: [facility], [place] [address] [place] [address] Phone: [phone] Fax: [phone]     Social Determinants of Health (SDOH) Interventions    Readmission Risk Interventions No flowsheet data found.

## 2020-12-16 NOTE — ED Notes (Signed)
Patient provided with pillow. Resting comfortably on stretcher. Denies needs. Call bell and phone in reach. Will continue to monitor.

## 2020-12-16 NOTE — ED Notes (Signed)
Tele  Breakfast Ordered 

## 2020-12-16 NOTE — Progress Notes (Signed)
PROGRESS NOTE                                                                                                                                                                                                             Patient Demographics:    Tracey Allen, is a 87 y.o. female, DOB - 04/22/1925, UXL:[id number]  Outpatient Primary MD for the patient is Nona Dell, Corene Cornea, MD    LOS - 1  Admit date - 12/15/2020    Chief Complaint  Patient presents with  . Altered Mental Status    Family states patient is "altered", MEDIC does not agree       Brief Narrative (HPI from H&P) [name] is a 87 y.o. female with medical history significant for hypertension, atrial fibrillation, CHF, CKD stage III, recent fall with pelvic fracture.  She was diagnosed with Covid a week specialization.  She has recently recovered from a fall with pelvic fracture, was currently at her home and was found to be confused by daughter walking in her backyard.  She was brought to the hospital where she was found to have COVID-19 pneumonia, dehydration with hypomagnesemia and AKI and was admitted.   Subjective:    Tracey Allen today has, No headache, No chest pain, No abdominal pain - No Nausea, No new weakness tingling or numbness, mild cough.   Assessment  & Plan :     1. Acute Hypoxic Resp. Failure due to Acute Covid 19 Viral Pneumonitis during the ongoing 2020 Covid 19 Pandemic - she has received 2 doses of Vaccine , last > 6 mths ago, has mild Covid PNA with Toxic encephalopathy, she is on Stroids + Remdesivir , monitor.  Encouraged the patient to sit up in chair in the daytime use I-S and flutter valve for pulmonary toiletry and then prone in bed when at night.  Will advance activity and titrate down oxygen as possible.    SpO2: 96 %  Recent Labs  Lab 12/15/20 1242 12/15/20 2102 12/15/20 2313 12/16/20 0613 12/16/20 0824  WBC 7.0  --   --   6.1  --   HGB 10.9*  --   --  10.6*  --   HCT 34.0*  --   --  31.5*  --   PLT 303  --   --  286  --  CRP  --  0.7 <0.5 0.6 0.7  BNP  --   --   --   --  173.1*  DDIMER  --   --  0.72*  --  0.85*  PROCALCITON  --  <0.10  --   --   --   AST 22  --   --   --   --   ALT 14  --   --   --   --   ALKPHOS 88  --   --   --   --   BILITOT 1.0  --   --   --   --   ALBUMIN 3.2*  --   --   --   --   SARSCOV2NAA  --  POSITIVE*  --   --   --     2. Dehydration with AKI and hypomagnesemia.  IV fluids and replace magnesium, hold home dose diuretic and ARB.  3.  Underlying dementia with toxic encephalopathy.  Supportive care, minimize narcotics and benzodiazepines.  CT scan head Nonacute..  4.  Paroxysmal A. fib.  Mali vas 2 score of greater than 4.  On Cardizem and Xarelto.  5.  Essential hypertension.  For now on Cardizem, will add hydralazine for better control.    Condition - Extremely Guarded  Family Communication  :  Daughter [phone] on 12/16/20  Code Status :  Full  Consults  :  None  Procedures  :    CT Head - non acute  PUD Prophylaxis :    Disposition Plan  :    Status is: Inpatient  Remains inpatient appropriate because:IV treatments appropriate due to intensity of illness or inability to take PO   Dispo: The patient is from: Home              Anticipated d/c is to: Home              Anticipated d/c date is: > 3 days              Patient currently is not medically stable to d/c.   DVT Prophylaxis  :  Xaralto  Lab Results  Component Value Date   PLT 286 12/16/2020    Diet :  Diet Order            Diet Heart Room service appropriate? Yes; Fluid consistency: Thin  Diet effective now                  Inpatient Medications  Scheduled Meds: . diltiazem  180 mg Oral Q breakfast  . DULoxetine  20 mg Oral Daily  . hydrALAZINE  50 mg Oral Q8H  . potassium chloride  10 mEq Oral Daily  . Rivaroxaban  15 mg Oral Q breakfast   Continuous Infusions: .  lactated ringers 75 mL/hr at 12/16/20 0820  . magnesium sulfate bolus IVPB    . remdesivir 100 mg in NS 100 mL     PRN Meds:.acetaminophen, albuterol, guaiFENesin-dextromethorphan, oxyCODONE-acetaminophen, senna-docusate  Antibiotics  :    Anti-infectives (From admission, onward)   Start     Dose/Rate Route Frequency Ordered Stop   12/16/20 1000  remdesivir 100 mg in sodium chloride 0.9 % 100 mL IVPB  Status:  Discontinued       "Followed by" Linked Group Details   100 mg 200 mL/hr over 30 Minutes Intravenous Daily 12/15/20 2244 12/15/20 2248   12/16/20 1000  remdesivir 100 mg in sodium chloride 0.9 % 100 mL  IVPB       "Followed by" Linked Group Details   100 mg 200 mL/hr over 30 Minutes Intravenous Daily 12/15/20 2150 12/20/20 0959   12/15/20 2200  remdesivir 200 mg in sodium chloride 0.9% 250 mL IVPB       "Followed by" Linked Group Details   200 mg 580 mL/hr over 30 Minutes Intravenous Once 12/15/20 2150 12/15/20 2302   12/15/20 2145  remdesivir 200 mg in sodium chloride 0.9% 250 mL IVPB  Status:  Discontinued       "Followed by" Linked Group Details   200 mg 580 mL/hr over 30 Minutes Intravenous Once 12/15/20 2244 12/15/20 2248       Time Spent in minutes  30   Lala Lund M.D on 12/16/2020 at 10:47 AM  To page go to [email or web address]   [facility] -  Office  [phone]     See all Orders from today for further details    Objective:   Vitals:   12/16/20 0700 12/16/20 0726 12/16/20 0800 12/16/20 0830  BP: (!) 147/87  (!) 156/92   Pulse:    62  Resp: 15  19   Temp:  98.2 F (36.8 C)    TempSrc:  Oral    SpO2:    96%  Weight:      Height:        Wt Readings from Last 3 Encounters:  12/15/20 68.4 kg  03/12/20 68.4 kg  01/06/20 66.2 kg     Intake/Output Summary (Last 24 hours) at 12/16/2020 1047 Last data filed at 12/16/2020 0724 Gross per 24 hour  Intake 1683.22 ml  Output --  Net 1683.22 ml     Physical Exam  Awake mildly confused, No new  F.N deficits,   [place].AT,PERRAL Supple Neck,No JVD, No cervical lymphadenopathy appriciated.  Symmetrical Chest wall movement, Good air movement bilaterally, CTAB RRR,No Gallops,Rubs or new Murmurs, No Parasternal Heave +ve B.Sounds, Abd Soft, No tenderness, No organomegaly appriciated, No rebound - guarding or rigidity. No Cyanosis, Clubbing or edema, No new Rash or bruise     Data Review:    CBC Recent Labs  Lab 12/15/20 1242 12/16/20 0613  WBC 7.0 6.1  HGB 10.9* 10.6*  HCT 34.0* 31.5*  PLT 303 286  MCV 93.9 94.0  MCH 30.1 31.6  MCHC 32.1 33.7  RDW 12.3 12.4  LYMPHSABS  --  2.0  MONOABS  --  0.5  EOSABS  --  0.1  BASOSABS  --  0.0    Recent Labs  Lab 12/15/20 1242 12/15/20 2102 12/15/20 2313 12/16/20 0613 12/16/20 0824  NA 137  --   --   --  139  K 3.4*  --   --   --  3.7  CL 101  --   --   --  104  CO2 24  --   --   --  22  GLUCOSE 114*  --   --   --  94  BUN 18  --   --   --  17  CREATININE 1.75*  --   --   --  1.56*  CALCIUM 8.9  --   --   --  8.9  AST 22  --   --   --   --   ALT 14  --   --   --   --   ALKPHOS 88  --   --   --   --   BILITOT 1.0  --   --   --   --  ALBUMIN 3.2*  --   --   --   --   MG  --   --   --   --  1.6*  CRP  --  0.7 <0.5 0.6 0.7  DDIMER  --   --  0.72*  --  0.85*  PROCALCITON  --  <0.10  --   --   --   BNP  --   --   --   --  173.1*    ------------------------------------------------------------------------------------------------------------------ Recent Labs    12/15/20 2102  TRIG 69    No results found for: HGBA1C ------------------------------------------------------------------------------------------------------------------ No results for input(s): TSH, T4TOTAL, T3FREE, THYROIDAB in the last 72 hours.  Invalid input(s): FREET3  Cardiac Enzymes No results for input(s): CKMB, TROPONINI, MYOGLOBIN in the last 168 hours.  Invalid input(s):  CK ------------------------------------------------------------------------------------------------------------------    Component Value Date/Time   BNP 173.1 (H) 12/16/2020 5284    Micro Results Recent Results (from the past 240 hour(s))  Resp Panel by RT-PCR (Flu A&B, Covid) Nasopharyngeal Swab     Status: Abnormal   Collection Time: 12/15/20  9:02 PM   Specimen: Nasopharyngeal Swab; Nasopharyngeal(NP) swabs in vial transport medium  Result Value Ref Range Status   SARS Coronavirus 2 by RT PCR POSITIVE (A) NEGATIVE Final    Comment: RESULT CALLED TO, READ BACK BY AND VERIFIED WITH: Clarene Critchley RN 12/15/20 AT 2220 SK  (NOTE) SARS-CoV-2 target nucleic acids are DETECTED.  The SARS-CoV-2 RNA is generally detectable in upper respiratory specimens during the acute phase of infection. Positive results are indicative of the presence of the identified virus, but do not rule out bacterial infection or co-infection with other pathogens not detected by the test. Clinical correlation with patient history and other diagnostic information is necessary to determine patient infection status. The expected result is Negative.  Fact Sheet for Patients: EntrepreneurPulse.com.au  Fact Sheet for Healthcare Providers: IncredibleEmployment.be  This test is not yet approved or cleared by the [place] FDA and  has been authorized for detection and/or diagnosis of SARS-CoV-2 by FDA under an Emergency Use Authorization (EUA).  This EUA will remain in effect (meaning this test can be  used) for the duration of  the COVID-19 declaration under Section 564(b)(1) of the Act, 21 U.S.C. section 360bbb-3(b)(1), unless the authorization is terminated or revoked sooner.     Influenza A by PCR NEGATIVE NEGATIVE Final   Influenza B by PCR NEGATIVE NEGATIVE Final    Comment: (NOTE) The Xpert Xpress SARS-CoV-2/FLU/RSV plus assay is intended as an aid in the diagnosis of  influenza from Nasopharyngeal swab specimens and should not be used as a sole basis for treatment. Nasal washings and aspirates are unacceptable for Xpert Xpress SARS-CoV-2/FLU/RSV testing.  Fact Sheet for Patients: EntrepreneurPulse.com.au  Fact Sheet for Healthcare Providers: IncredibleEmployment.be  This test is not yet approved or cleared by the [place] FDA and has been authorized for detection and/or diagnosis of SARS-CoV-2 by FDA under an Emergency Use Authorization (EUA). This EUA will remain in effect (meaning this test can be used) for the duration of the COVID-19 declaration under Section 564(b)(1) of the Act, 21 U.S.C. section 360bbb-3(b)(1), unless the authorization is terminated or revoked.  Performed at [facility], [place] [address], [place], [place] [address]     Radiology Reports CT HEAD WO CONTRAST  Result Date: 12/15/2020 CLINICAL DATA:  Mental status changes EXAM: CT HEAD WITHOUT CONTRAST TECHNIQUE: Contiguous axial images were obtained from the base of the skull  through the vertex without intravenous contrast. COMPARISON:  11/15/2020 FINDINGS: Brain: There is atrophy and chronic small vessel disease changes. No acute intracranial abnormality. Specifically, no hemorrhage, hydrocephalus, mass lesion, acute infarction, or significant intracranial injury. Vascular: No hyperdense vessel or unexpected calcification. Skull: No acute calvarial abnormality. Sinuses/Orbits: No acute findings Other: None IMPRESSION: Atrophy, chronic microvascular disease. No acute intracranial abnormality. Electronically Signed   By: Rolm Baptise M.D.   On: 12/15/2020 14:13   DG Chest Port 1 View  Result Date: 12/15/2020 CLINICAL DATA:  Cough, COVID EXAM: PORTABLE CHEST 1 VIEW COMPARISON:  07/02/2020 FINDINGS: Cardiomegaly. Patchy bilateral airspace opacities. Diffuse interstitial prominence, likely underlying chronic lung disease. No effusions or  pneumothorax. IMPRESSION: Patchy bilateral airspace opacities superimposed on chronic lung disease. Findings concerning for multifocal pneumonia. Cardiomegaly. Electronically Signed   By: Rolm Baptise M.D.   On: 12/15/2020 14:10

## 2020-12-16 NOTE — ED Notes (Signed)
Pt got out of bed, swung door open and walked out of room stating: "I need to find my shoes". Pt had removed cardiac leads, Put gown on backwards. Pt urinated all over the floor and on self. This RN and Abran Richard, RN guided pt back into room, cleaned pt up and put new sheets on stretcher and put a posey alarm on the stretcher. Pt is altered. MD has been notified.

## 2020-12-16 NOTE — ED Notes (Signed)
Report just gotten on this pt by Carlis Abbott, RN.

## 2020-12-17 DIAGNOSIS — J1282 Pneumonia due to coronavirus disease 2019: Secondary | ICD-10-CM | POA: Diagnosis not present

## 2020-12-17 DIAGNOSIS — U071 COVID-19: Principal | ICD-10-CM

## 2020-12-17 LAB — COMPREHENSIVE METABOLIC PANEL
ALT: 14 U/L (ref 0–44)
AST: 22 U/L (ref 15–41)
Albumin: 3.2 g/dL — ABNORMAL LOW (ref 3.5–5.0)
Alkaline Phosphatase: 91 U/L (ref 38–126)
Anion gap: 14 (ref 5–15)
BUN: 24 mg/dL — ABNORMAL HIGH (ref 8–23)
CO2: 22 mmol/L (ref 22–32)
Calcium: 9.6 mg/dL (ref 8.9–10.3)
Chloride: 104 mmol/L (ref 98–111)
Creatinine, Ser: 1.47 mg/dL — ABNORMAL HIGH (ref 0.44–1.00)
GFR, Estimated: 33 mL/min — ABNORMAL LOW (ref >60)
Glucose, Bld: 148 mg/dL — ABNORMAL HIGH (ref 70–99)
Potassium: 4.4 mmol/L (ref 3.5–5.1)
Sodium: 140 mmol/L (ref 135–145)
Total Bilirubin: 0.9 mg/dL (ref 0.3–1.2)
Total Protein: 6.8 g/dL (ref 6.5–8.1)

## 2020-12-17 LAB — CBC WITH DIFFERENTIAL/PLATELET
Abs Immature Granulocytes: 0.03 10*3/uL (ref 0.00–0.07)
Basophils Absolute: 0 10*3/uL (ref 0.0–0.1)
Basophils Relative: 0 %
Eosinophils Absolute: 0 10*3/uL (ref 0.0–0.5)
Eosinophils Relative: 0 %
HCT: 36.2 % (ref 36.0–46.0)
Hemoglobin: 12 g/dL (ref 12.0–15.0)
Immature Granulocytes: 1 %
Lymphocytes Relative: 23 %
Lymphs Abs: 1.5 10*3/uL (ref 0.7–4.0)
MCH: 30.4 pg (ref 26.0–34.0)
MCHC: 33.1 g/dL (ref 30.0–36.0)
MCV: 91.6 fL (ref 80.0–100.0)
Monocytes Absolute: 0.3 10*3/uL (ref 0.1–1.0)
Monocytes Relative: 4 %
Neutro Abs: 4.5 10*3/uL (ref 1.7–7.7)
Neutrophils Relative %: 72 %
Platelets: 357 10*3/uL (ref 150–400)
RBC: 3.95 MIL/uL (ref 3.87–5.11)
RDW: 12.4 % (ref 11.5–15.5)
WBC: 6.3 10*3/uL (ref 4.0–10.5)
nRBC: 0 % (ref 0.0–0.2)

## 2020-12-17 LAB — C-REACTIVE PROTEIN: CRP: 0.7 mg/dL (ref <1.0)

## 2020-12-17 LAB — BRAIN NATRIURETIC PEPTIDE: B Natriuretic Peptide: 549.7 pg/mL — ABNORMAL HIGH (ref 0.0–100.0)

## 2020-12-17 LAB — MAGNESIUM: Magnesium: 2.1 mg/dL (ref 1.7–2.4)

## 2020-12-17 LAB — D-DIMER, QUANTITATIVE: D-Dimer, Quant: 1.24 ug/mL-FEU — ABNORMAL HIGH (ref 0.00–0.50)

## 2020-12-17 MED ORDER — PANTOPRAZOLE SODIUM 40 MG IV SOLR
40.0000 mg | Freq: Once | INTRAVENOUS | Status: AC
  Administered 2020-12-17: 40 mg via INTRAVENOUS
  Filled 2020-12-17: qty 40

## 2020-12-17 MED ORDER — DILTIAZEM LOAD VIA INFUSION
15.0000 mg | Freq: Once | INTRAVENOUS | Status: AC
  Administered 2020-12-17: 15 mg via INTRAVENOUS
  Filled 2020-12-17: qty 15

## 2020-12-17 MED ORDER — QUETIAPINE FUMARATE 25 MG PO TABS
12.5000 mg | ORAL_TABLET | Freq: Two times a day (BID) | ORAL | Status: DC
  Administered 2020-12-17 – 2020-12-19 (×4): 12.5 mg via ORAL
  Filled 2020-12-17 (×7): qty 1

## 2020-12-17 MED ORDER — DIGOXIN 0.25 MG/ML IJ SOLN
0.1250 mg | Freq: Four times a day (QID) | INTRAMUSCULAR | Status: AC
Start: 2020-12-17 — End: 2020-12-17
  Administered 2020-12-17 (×2): 0.125 mg via INTRAVENOUS
  Filled 2020-12-17 (×2): qty 2

## 2020-12-17 MED ORDER — LACTATED RINGERS IV SOLN: INTRAVENOUS | Status: AC

## 2020-12-17 MED ORDER — DILTIAZEM HCL-DEXTROSE 125-5 MG/125ML-% IV SOLN (PREMIX)
5.0000 mg/h | INTRAVENOUS | Status: DC
  Administered 2020-12-17: 5 mg/h via INTRAVENOUS
  Filled 2020-12-17: qty 125

## 2020-12-17 MED ORDER — DILTIAZEM HCL 60 MG PO TABS
120.0000 mg | ORAL_TABLET | Freq: Three times a day (TID) | ORAL | Status: DC
  Administered 2020-12-17 – 2020-12-20 (×6): 120 mg via ORAL
  Filled 2020-12-17 (×11): qty 2

## 2020-12-17 MED ORDER — HALOPERIDOL LACTATE 5 MG/ML IJ SOLN
2.0000 mg | Freq: Four times a day (QID) | INTRAMUSCULAR | Status: DC | PRN
  Administered 2020-12-17 (×2): 2 mg via INTRAVENOUS
  Filled 2020-12-17 (×3): qty 1

## 2020-12-17 NOTE — ED Notes (Signed)
Pt keeps getting out of bed.Marland KitchenMarland Kitchen

## 2020-12-17 NOTE — ED Notes (Signed)
Attempted to Give Report x1.

## 2020-12-17 NOTE — Progress Notes (Signed)
Cross-coverage note:   Patient is confused, getting out of bed and is high fall-risk. Non-pharmacologic and pharmacologic interventions unsuccessful. Orders had been placed for sitter but notified that there are no sitters available. Soft restraints ordered for patient safety, will remove as soon as safe to do so.

## 2020-12-17 NOTE — Progress Notes (Signed)
PROGRESS NOTE                                                                                                                                                                                                             Patient Demographics:    Tracey Allen, is a 87 y.o. female, DOB - [birth year], TZG:[id number]  Outpatient Primary MD for the patient is Nona Dell, Corene Cornea, MD    LOS - 2  Admit date - 12/15/2020    Chief Complaint  Patient presents with  . Altered Mental Status    Family states patient is "altered", MEDIC does not agree       Brief Narrative (HPI from H&P) Tracey Allen is a 87 y.o. female with medical history significant for hypertension, atrial fibrillation, CHF, CKD stage III, recent fall with pelvic fracture.  She was diagnosed with Covid a week specialization.  She has recently recovered from a fall with pelvic fracture, was currently at her home and was found to be confused by daughter walking in her backyard.  She was brought to the hospital where she was found to have COVID-19 pneumonia, dehydration with hypomagnesemia and AKI and was admitted.   Subjective:   Patient in bed, severely confused unable to answer questions or follow commands reliably but appears to be in no distress.   Assessment  & Plan :     1. Acute Hypoxic Resp. Failure due to Acute Covid 19 Viral Pneumonitis during the ongoing 2020 Covid 19 Pandemic - she has received 2 doses of Vaccine , last > 6 mths ago, has mild Covid PNA with Toxic encephalopathy, she is on Stroids + Remdesivir , monitor.  Encouraged the patient to sit up in chair in the daytime use I-S and flutter valve for pulmonary toiletry and then prone in bed when at night.  Will advance activity and titrate down oxygen as possible.    SpO2: 94 %  Recent Labs  Lab 12/15/20 1242 12/15/20 2102 12/15/20 2313 12/16/20 0613 12/16/20 0824 12/17/20 0631  WBC 7.0  --    --  6.1  --  6.3  HGB 10.9*  --   --  10.6*  --  12.0  HCT 34.0*  --   --  31.5*  --  36.2  PLT 303  --   --  286  --  357  CRP  --  0.7 <0.5 0.6 0.7 0.7  BNP  --   --   --   --  173.1* 549.7*  DDIMER  --   --  0.72*  --  0.85* 1.24*  PROCALCITON  --  <0.10  --   --   --   --   AST 22  --   --   --   --  22  ALT 14  --   --   --   --  14  ALKPHOS 88  --   --   --   --  91  BILITOT 1.0  --   --   --   --  0.9  ALBUMIN 3.2*  --   --   --   --  3.2*  SARSCOV2NAA  --  POSITIVE*  --   --   --   --     2. Dehydration with AKI and hypomagnesemia.  Improving after hydration with IV fluids and electrolyte replacement, hold home dose diuretic and ARB.  3.  Underlying dementia with toxic encephalopathy.  Supportive care, minimize narcotics and benzodiazepines.  CT scan head Nonacute.  Encephalopathy is getting worse and can be life-threatening due to decreased oral intake, risk and for aspiration, daughter updated guarded prognosis, she is DNR.  4.  Paroxysmal A. fib.  Mali vas 2 score of greater than 4.  Currently in RVR hence on IV Cardizem drip, oral Cardizem dose increased, 2 doses of IV digoxin, continue Xarelto  5.  Essential hypertension.  She is currently on Cardizem will monitor.    Condition - Extremely Guarded  Family Communication  :  Daughter [phone] on 12/16/20, 12/17/20 - DNR  Code Status :  DNR  Consults  :  None  Procedures  :    CT Head - non acute  PUD Prophylaxis :    Disposition Plan  :    Status is: Inpatient  Remains inpatient appropriate because:IV treatments appropriate due to intensity of illness or inability to take PO   Dispo: The patient is from: Home              Anticipated d/c is to: Home              Anticipated d/c date is: > 3 days              Patient currently is not medically stable to d/c.   DVT Prophylaxis  :  Xaralto  Lab Results  Component Value Date   PLT 357 12/17/2020    Diet :  Diet Order            Diet Heart Room  service appropriate? Yes; Fluid consistency: Thin  Diet effective now                  Inpatient Medications  Scheduled Meds: . dexamethasone (DECADRON) injection  6 mg Intravenous Q24H  . digoxin  0.125 mg Intravenous Q6H  . diltiazem  120 mg Oral Q8H  . DULoxetine  20 mg Oral Daily  . pantoprazole (PROTONIX) IV  40 mg Intravenous Once  . potassium chloride  10 mEq Oral Daily  . QUEtiapine  12.5 mg Oral BID  . Rivaroxaban  15 mg Oral Q breakfast   Continuous Infusions: . diltiazem (CARDIZEM) infusion 5 mg/hr (12/17/20 0725)  . remdesivir 100 mg in NS 100 mL Stopped (12/16/20 1328)   PRN Meds:.acetaminophen, albuterol, guaiFENesin-dextromethorphan, haloperidol lactate, senna-docusate  Antibiotics  :  Anti-infectives (From admission, onward)   Start     Dose/Rate Route Frequency Ordered Stop   12/16/20 1000  remdesivir 100 mg in sodium chloride 0.9 % 100 mL IVPB  Status:  Discontinued       "Followed by" Linked Group Details   100 mg 200 mL/hr over 30 Minutes Intravenous Daily 12/15/20 2244 12/15/20 2248   12/16/20 1000  remdesivir 100 mg in sodium chloride 0.9 % 100 mL IVPB       "Followed by" Linked Group Details   100 mg 200 mL/hr over 30 Minutes Intravenous Daily 12/15/20 2150 12/20/20 0959   12/15/20 2200  remdesivir 200 mg in sodium chloride 0.9% 250 mL IVPB       "Followed by" Linked Group Details   200 mg 580 mL/hr over 30 Minutes Intravenous Once 12/15/20 2150 12/15/20 2302   12/15/20 2145  remdesivir 200 mg in sodium chloride 0.9% 250 mL IVPB  Status:  Discontinued       "Followed by" Linked Group Details   200 mg 580 mL/hr over 30 Minutes Intravenous Once 12/15/20 2244 12/15/20 2248       Time Spent in minutes  30   Lala Lund M.D on 12/17/2020 at 9:03 AM  To page go to [email or web address]   [facility] -  Office  [phone]  See all Orders from today for further details    Objective:   Vitals:   12/17/20 0500 12/17/20 0545 12/17/20 0730  12/17/20 0800  BP: (!) 153/88 123/72 119/76 122/77  Pulse: 99 (!) 133 (!) 53 (!) 25  Resp: (!) 21 15 (!) 22 (!) 25  Temp:      TempSrc:      SpO2: 92% 100% 94%   Weight:      Height:        Wt Readings from Last 3 Encounters:  12/15/20 68.4 kg  03/12/20 68.4 kg  01/06/20 66.2 kg     Intake/Output Summary (Last 24 hours) at 12/17/2020 0903 Last data filed at 12/16/2020 1742 Gross per 24 hour  Intake 761.19 ml  Output --  Net 761.19 ml     Physical Exam  Awake but confused No new F.N deficits,   [place].AT,PERRAL Supple Neck,No JVD, No cervical lymphadenopathy appriciated.  Symmetrical Chest wall movement, Good air movement bilaterally, CTAB iRRR,No Gallops, Rubs or new Murmurs, No Parasternal Heave +ve B.Sounds, Abd Soft, No tenderness, No organomegaly appriciated, No rebound - guarding or rigidity. No Cyanosis, Clubbing or edema, No new Rash or bruise    Data Review:    CBC Recent Labs  Lab 12/15/20 1242 12/16/20 0613 12/17/20 0631  WBC 7.0 6.1 6.3  HGB 10.9* 10.6* 12.0  HCT 34.0* 31.5* 36.2  PLT 303 286 357  MCV 93.9 94.0 91.6  MCH 30.1 31.6 30.4  MCHC 32.1 33.7 33.1  RDW 12.3 12.4 12.4  LYMPHSABS  --  2.0 1.5  MONOABS  --  0.5 0.3  EOSABS  --  0.1 0.0  BASOSABS  --  0.0 0.0    Recent Labs  Lab 12/15/20 1242 12/15/20 2102 12/15/20 2313 12/16/20 0613 12/16/20 0824 12/17/20 0631  NA 137  --   --   --  139 140  K 3.4*  --   --   --  3.7 4.4  CL 101  --   --   --  104 104  CO2 24  --   --   --  22 22  GLUCOSE 114*  --   --   --  94 148*  BUN 18  --   --   --  17 24*  CREATININE 1.75*  --   --   --  1.56* 1.47*  CALCIUM 8.9  --   --   --  8.9 9.6  AST 22  --   --   --   --  22  ALT 14  --   --   --   --  14  ALKPHOS 88  --   --   --   --  91  BILITOT 1.0  --   --   --   --  0.9  ALBUMIN 3.2*  --   --   --   --  3.2*  MG  --   --   --   --  1.6* 2.1  CRP  --  0.7 <0.5 0.6 0.7 0.7  DDIMER  --   --  0.72*  --  0.85* 1.24*  PROCALCITON  --  <0.10   --   --   --   --   BNP  --   --   --   --  173.1* 549.7*    ------------------------------------------------------------------------------------------------------------------ Recent Labs    12/15/20 2102  TRIG 69    No results found for: HGBA1C ------------------------------------------------------------------------------------------------------------------ No results for input(s): TSH, T4TOTAL, T3FREE, THYROIDAB in the last 72 hours.  Invalid input(s): FREET3  Cardiac Enzymes No results for input(s): CKMB, TROPONINI, MYOGLOBIN in the last 168 hours.  Invalid input(s): CK ------------------------------------------------------------------------------------------------------------------    Component Value Date/Time   BNP 549.7 (H) 12/17/2020 0631    Micro Results Recent Results (from the past 240 hour(s))  Resp Panel by RT-PCR (Flu A&B, Covid) Nasopharyngeal Swab     Status: Abnormal   Collection Time: 12/15/20  9:02 PM   Specimen: Nasopharyngeal Swab; Nasopharyngeal(NP) swabs in vial transport medium  Result Value Ref Range Status   SARS Coronavirus 2 by RT PCR POSITIVE (A) NEGATIVE Final    Comment: RESULT CALLED TO, READ BACK BY AND VERIFIED WITH: Clarene Critchley RN 12/15/20 AT 2220 SK  (NOTE) SARS-CoV-2 target nucleic acids are DETECTED.  The SARS-CoV-2 RNA is generally detectable in upper respiratory specimens during the acute phase of infection. Positive results are indicative of the presence of the identified virus, but do not rule out bacterial infection or co-infection with other pathogens not detected by the test. Clinical correlation with patient history and other diagnostic information is necessary to determine patient infection status. The expected result is Negative.  Fact Sheet for Patients: EntrepreneurPulse.com.au  Fact Sheet for Healthcare Providers: IncredibleEmployment.be  This test is not yet approved or cleared by  the [place] FDA and  has been authorized for detection and/or diagnosis of SARS-CoV-2 by FDA under an Emergency Use Authorization (EUA).  This EUA will remain in effect (meaning this test can be  used) for the duration of  the COVID-19 declaration under Section 564(b)(1) of the Act, 21 U.S.C. section 360bbb-3(b)(1), unless the authorization is terminated or revoked sooner.     Influenza A by PCR NEGATIVE NEGATIVE Final   Influenza B by PCR NEGATIVE NEGATIVE Final    Comment: (NOTE) The Xpert Xpress SARS-CoV-2/FLU/RSV plus assay is intended as an aid in the diagnosis of influenza from Nasopharyngeal swab specimens and should not be used as a sole basis for treatment. Nasal washings and aspirates are unacceptable for Xpert Xpress SARS-CoV-2/FLU/RSV testing.  Fact Sheet for Patients: EntrepreneurPulse.com.au  Fact Sheet for Healthcare Providers: IncredibleEmployment.be  This  test is not yet approved or cleared by the [place] and has been authorized for detection and/or diagnosis of SARS-CoV-2 by FDA under an Emergency Use Authorization (EUA). This EUA will remain in effect (meaning this test can be used) for the duration of the COVID-19 declaration under Section 564(b)(1) of the Act, 21 U.S.C. section 360bbb-3(b)(1), unless the authorization is terminated or revoked.  Performed at [facility], [place] [address], [place], [place] [address]     Radiology Reports CT HEAD WO CONTRAST  Result Date: 12/15/2020 CLINICAL DATA:  Mental status changes EXAM: CT HEAD WITHOUT CONTRAST TECHNIQUE: Contiguous axial images were obtained from the base of the skull through the vertex without intravenous contrast. COMPARISON:  11/15/2020 FINDINGS: Brain: There is atrophy and chronic small vessel disease changes. No acute intracranial abnormality. Specifically, no hemorrhage, hydrocephalus, mass lesion, acute infarction, or significant  intracranial injury. Vascular: No hyperdense vessel or unexpected calcification. Skull: No acute calvarial abnormality. Sinuses/Orbits: No acute findings Other: None IMPRESSION: Atrophy, chronic microvascular disease. No acute intracranial abnormality. Electronically Signed   By: Rolm Baptise M.D.   On: 12/15/2020 14:13   DG Chest Port 1 View  Result Date: 12/15/2020 CLINICAL DATA:  Cough, COVID EXAM: PORTABLE CHEST 1 VIEW COMPARISON:  07/02/2020 FINDINGS: Cardiomegaly. Patchy bilateral airspace opacities. Diffuse interstitial prominence, likely underlying chronic lung disease. No effusions or pneumothorax. IMPRESSION: Patchy bilateral airspace opacities superimposed on chronic lung disease. Findings concerning for multifocal pneumonia. Cardiomegaly. Electronically Signed   By: Rolm Baptise M.D.   On: 12/15/2020 14:10

## 2020-12-17 NOTE — ED Notes (Signed)
Pt continues to get out of bed.Marland KitchenMarland Kitchen

## 2020-12-17 NOTE — ED Notes (Signed)
Lunch Tray Ordered @ 1045 

## 2020-12-17 NOTE — ED Notes (Signed)
Restrains reapplied. Good circulation on all extremities, able to fit one finger underneath each restraint. Pt given warm blankets.

## 2020-12-17 NOTE — ED Notes (Signed)
Md notified of pts continuous afib with rvr.

## 2020-12-17 NOTE — ED Notes (Signed)
Pt keeps getting out of bed. Pt not following commands to stay in bed. Pt is becoming agitated. MD notified.

## 2020-12-17 NOTE — ED Notes (Signed)
Pt continues to get out of restraints.  Informed charge RN of the situation.

## 2020-12-17 NOTE — ED Notes (Signed)
Md notified of pts afib rvr. Pt keeps getting out of wrist restraints.

## 2020-12-18 ENCOUNTER — Inpatient Hospital Stay (HOSPITAL_COMMUNITY): Payer: Medicare Other

## 2020-12-18 DIAGNOSIS — J1282 Pneumonia due to coronavirus disease 2019: Secondary | ICD-10-CM | POA: Diagnosis not present

## 2020-12-18 DIAGNOSIS — U071 COVID-19: Secondary | ICD-10-CM | POA: Diagnosis not present

## 2020-12-18 LAB — CBC WITH DIFFERENTIAL/PLATELET
Abs Immature Granulocytes: 0.04 10*3/uL (ref 0.00–0.07)
Basophils Absolute: 0 10*3/uL (ref 0.0–0.1)
Basophils Relative: 0 %
Eosinophils Absolute: 0 10*3/uL (ref 0.0–0.5)
Eosinophils Relative: 0 %
HCT: 36 % (ref 36.0–46.0)
Hemoglobin: 12.3 g/dL (ref 12.0–15.0)
Immature Granulocytes: 1 %
Lymphocytes Relative: 16 %
Lymphs Abs: 1.4 10*3/uL (ref 0.7–4.0)
MCH: 31.4 pg (ref 26.0–34.0)
MCHC: 34.2 g/dL (ref 30.0–36.0)
MCV: 91.8 fL (ref 80.0–100.0)
Monocytes Absolute: 0.4 10*3/uL (ref 0.1–1.0)
Monocytes Relative: 4 %
Neutro Abs: 7 10*3/uL (ref 1.7–7.7)
Neutrophils Relative %: 79 %
Platelets: 349 10*3/uL (ref 150–400)
RBC: 3.92 MIL/uL (ref 3.87–5.11)
RDW: 12.6 % (ref 11.5–15.5)
WBC: 8.8 10*3/uL (ref 4.0–10.5)
nRBC: 0 % (ref 0.0–0.2)

## 2020-12-18 LAB — COMPREHENSIVE METABOLIC PANEL
ALT: 14 U/L (ref 0–44)
AST: 18 U/L (ref 15–41)
Albumin: 3.1 g/dL — ABNORMAL LOW (ref 3.5–5.0)
Alkaline Phosphatase: 91 U/L (ref 38–126)
Anion gap: 10 (ref 5–15)
BUN: 28 mg/dL — ABNORMAL HIGH (ref 8–23)
CO2: 23 mmol/L (ref 22–32)
Calcium: 9.3 mg/dL (ref 8.9–10.3)
Chloride: 108 mmol/L (ref 98–111)
Creatinine, Ser: 1.49 mg/dL — ABNORMAL HIGH (ref 0.44–1.00)
GFR, Estimated: 33 mL/min — ABNORMAL LOW (ref >60)
Glucose, Bld: 140 mg/dL — ABNORMAL HIGH (ref 70–99)
Potassium: 4.2 mmol/L (ref 3.5–5.1)
Sodium: 141 mmol/L (ref 135–145)
Total Bilirubin: 1 mg/dL (ref 0.3–1.2)
Total Protein: 6.5 g/dL (ref 6.5–8.1)

## 2020-12-18 LAB — MAGNESIUM: Magnesium: 2.2 mg/dL (ref 1.7–2.4)

## 2020-12-18 LAB — C-REACTIVE PROTEIN: CRP: 0.6 mg/dL (ref <1.0)

## 2020-12-18 LAB — BRAIN NATRIURETIC PEPTIDE: B Natriuretic Peptide: 390.1 pg/mL — ABNORMAL HIGH (ref 0.0–100.0)

## 2020-12-18 LAB — D-DIMER, QUANTITATIVE: D-Dimer, Quant: 0.94 ug/mL-FEU — ABNORMAL HIGH (ref 0.00–0.50)

## 2020-12-18 MED ORDER — POLYETHYLENE GLYCOL 3350 17 G PO PACK
8.5000 g | PACK | Freq: Every day | ORAL | Status: DC
  Filled 2020-12-18: qty 1

## 2020-12-18 NOTE — Plan of Care (Signed)
  Problem: Respiratory: Goal: Will maintain a patent airway 12/18/2020 2045 by Benson Norway, RN Outcome: Progressing 12/18/2020 2045 by Benson Norway, RN Outcome: Progressing Goal: Complications related to the disease process, condition or treatment will be avoided or minimized 12/18/2020 2045 by Benson Norway, RN Outcome: Progressing 12/18/2020 2045 by Benson Norway, RN Outcome: Progressing   Problem: Safety: Goal: Ability to remain free from injury will improve Outcome: Progressing   Problem: Education: Goal: Knowledge of risk factors and measures for prevention of condition will improve 12/18/2020 2045 by Benson Norway, RN Outcome: Not Progressing 12/18/2020 2045 by Benson Norway, RN Outcome: Not Progressing   Problem: Coping: Goal: Psychosocial and spiritual needs will be supported 12/18/2020 2045 by Benson Norway, RN Outcome: Not Progressing 12/18/2020 2045 by Benson Norway, RN Outcome: Progressing   Problem: Education: Goal: Knowledge of General Education information will improve Description: Including pain rating scale, medication(s)/side effects and non-pharmacologic comfort measures Outcome: Not Progressing   Problem: Health Behavior/Discharge Planning: Goal: Ability to manage health-related needs will improve Outcome: Not Progressing   Problem: Activity: Goal: Risk for activity intolerance will decrease Outcome: Not Progressing   Problem: Nutrition: Goal: Adequate nutrition will be maintained Outcome: Not Progressing

## 2020-12-18 NOTE — Evaluation (Signed)
Physical Therapy Evaluation Patient Details Name: Tracey Allen MRN: [id number] DOB: 1925/03/25 Today's Date: 12/18/2020   History of Present Illness  Pt is a 87 y.o. female admitted from home on 12/15/20 after found to be confused by daughter. Workup for acute hypoxic respiratory failure due to COVID-19 PNA, dehydration, AKI, underlying dementia with toxic encephalopathy. Head CT negative for acute abnormality. PMH includes recent pelvic fx due to fall, HTN, afib, CHF, CKD III.    Clinical Impression  Pt presents with an overall decrease in functional mobility secondary to above. Pt poor historian with minimal answers to questions; per chart, from home, ambulating with RW and working with HHPT since recent pelvic fx. Today, pt limited by lethargy and AMS, becoming somewhat more alert with mobility and following some commands; requiring up to modA+2 to stand and take steps; pt unaware of bladder/bowel incontinence, dependent for pericare. Pt would benefit from continued acute PT services to maximize functional mobility and independence prior to d/c with SNF-level therapies.  SpO2 96% on RA     Follow Up Recommendations SNF;Supervision/Assistance - 24 hour    Equipment Recommendations   (TBD)    Recommendations for Other Services       Precautions / Restrictions Precautions Precautions: Fall;Other (comment) Precaution Comments: bladder/bowel incontinence Restrictions Weight Bearing Restrictions: No      Mobility  Bed Mobility Overal bed mobility: Needs Assistance Bed Mobility: Supine to Sit     Supine to sit: Mod assist;+2 for physical assistance;+2 for safety/equipment     General bed mobility comments: Pt keeping eyes closed with minimal movement initiation despite cues, requiring modA+2 to initiate movement, including trunk elevation and scooting hips to EOB; pt becoming more alert once sitting up    Transfers Overall transfer level: Needs assistance Equipment used: 1  person hand held assist;2 person hand held assist Transfers: Sit to/from Stand Sit to Stand: Min assist;+2 safety/equipment;Mod assist;+2 physical assistance         General transfer comment: Initial minA to stand from EOB (+2 safety), pt unaware of bladder/bowel incontinence upon standing; additional trials to/from [facility] and recliner, pt requiring modA+2 to elevate trunk; increased need for assist seems more related to cognitive impairment then physical weakness  Ambulation/Gait Ambulation/Gait assistance: Min assist;Mod assist;+2 safety/equipment Gait Distance (Feet): 4 Feet Assistive device: 1 person hand held assist Gait Pattern/deviations: Step-to pattern;Trunk flexed;Leaning posteriorly Gait velocity: Decreased   General Gait Details: Pivotal steps to [facility] with minA+2 safety, max tactile/verbal cues for direction; additional steps to recliner with modA, +2 assist as pt with increased fatigue and impaired cognition, attempting to sit prematurely; consistent assist to maintain upright due to posterior lean  Stairs            Wheelchair Mobility    Modified Rankin (Stroke Patients Only)       Balance Overall balance assessment: Needs assistance   Sitting balance-Leahy Scale: Fair       Standing balance-Leahy Scale: Poor Standing balance comment: Reliant on UE support and external assist; dependent for pericare                             Pertinent Vitals/Pain Pain Assessment: Faces Faces Pain Scale: Hurts a little bit Pain Location: Generalized, unable to specify Pain Descriptors / Indicators: Grimacing Pain Intervention(s): Monitored during session;Repositioned    Home Living Family/patient expects to be discharged to:: Skilled nursing facility  Prior Function Level of Independence: Needs assistance         Comments: Pt poor historian. Per chart, recently recovered from pelvic fx, ambulating with RW and working with  [place]. Daughter had found pt two days prior to admission with bag packed and wandering yard. HHPT reports AMS which is why EMS was called     Hand Dominance        Extremity/Trunk Assessment   Upper Extremity Assessment Upper Extremity Assessment: Generalized weakness;Difficult to assess due to impaired cognition    Lower Extremity Assessment Lower Extremity Assessment: Generalized weakness;Difficult to assess due to impaired cognition       Communication      Cognition Arousal/Alertness: Lethargic;Awake/alert Behavior During Therapy: Flat affect Overall Cognitive Status: No family/caregiver present to determine baseline cognitive functioning                                 General Comments: Able to state first name, minimal verbalizations to answer questions beyond this. Stating "yes" to majority of MD's questions. Inconsistently following simple commands. Keeping eyes open more with upright mobility. At end of session states, "I don't think I'm doing good" in response to therapist's encouragement      General Comments      Exercises     Assessment/Plan    PT Assessment Patient needs continued PT services  PT Problem List Decreased strength;Decreased activity tolerance;Decreased mobility;Decreased cognition;Decreased knowledge of use of DME;Decreased safety awareness;Decreased balance       PT Treatment Interventions DME instruction;Gait training;Stair training;Functional mobility training;Therapeutic activities;Therapeutic exercise;Balance training;Patient/family education    PT Goals (Current goals can be found in the Care Plan section)  Acute Rehab PT Goals Patient Stated Goal: None stated PT Goal Formulation: Patient unable to participate in goal setting Time For Goal Achievement: 01/01/21 Potential to Achieve Goals: Fair    Frequency Min 2X/week   Barriers to discharge        Co-evaluation PT/OT/SLP Co-Evaluation/Treatment: Yes Reason for  Co-Treatment: Necessary to address cognition/behavior during functional activity;For patient/therapist safety;To address functional/ADL transfers PT goals addressed during session: Mobility/safety with mobility;Balance         AM-PAC PT "6 Clicks" Mobility  Outcome Measure Help needed turning from your back to your side while in a flat bed without using bedrails?: A Lot Help needed moving from lying on your back to sitting on the side of a flat bed without using bedrails?: A Lot Help needed moving to and from a bed to a chair (including a wheelchair)?: A Lot Help needed standing up from a chair using your arms (e.g., wheelchair or bedside chair)?: A Lot Help needed to walk in hospital room?: A Lot Help needed climbing 3-5 steps with a railing? : A Lot 6 Click Score: 12    End of Session   Activity Tolerance: Patient limited by fatigue;Patient limited by lethargy Patient left: in chair;with call bell/phone within reach;with chair alarm set Nurse Communication: Mobility status PT Visit Diagnosis: Other abnormalities of gait and mobility (R26.89);Muscle weakness (generalized) (M62.81)    Time: 8676-1950 PT Time Calculation (min) (ACUTE ONLY): 24 min   Charges:   PT Evaluation $PT Eval Moderate Complexity: 1 Mod     Mabeline Caras, PT, DPT Acute Rehabilitation Services  Pager [phone] Office [place] 12/18/2020, 11:31 AM

## 2020-12-18 NOTE — Progress Notes (Signed)
Received report from ER nurse in person.

## 2020-12-18 NOTE — Progress Notes (Signed)
Attempted to review/instruct patient on use of incentive spirometer but patient unable to follow commands at this time.

## 2020-12-18 NOTE — Evaluation (Signed)
Occupational Therapy Evaluation Patient Details Name: Tracey Allen MRN: [id number] DOB: Oct 25, 1925 Today's Date: 12/18/2020    History of Present Illness Pt is a 87 y.o. female admitted from home on 12/15/20 after found to be confused by daughter. Workup for acute hypoxic respiratory failure due to COVID-19 PNA, dehydration, AKI, underlying dementia with toxic encephalopathy. Head CT negative for acute abnormality. PMH includes recent pelvic fx due to fall, HTN, afib, CHF, CKD III.   Clinical Impression   This 87 y/o female presents with the above. Per chart pt living at home PTA and was ambulatory. Pt awake upon arrival but with notable cognitive deficits. Pt clarifying how to pronounce her first name, though typically responding "yes" to all yes/no questions and with no other verbalizations/responses to questions during session. Pt minimally follows commands given max multimodal cues. She tolerated OOB to [facility] and then to recliner given two person assist throughout. Pt requiring up to [place] for UB, LB and toileting ADL, able to carryover self-feeding task with minA end of session. VSS on RA. Pt to benefit from continued acute OT services and currently recommend SNF level therapies at time of discharge to maximize her overall safety and independence with ADL and mobility.     Follow Up Recommendations  SNF;Supervision/Assistance - 24 hour    Equipment Recommendations  Other (comment) (defer to next venue)           Precautions / Restrictions Precautions Precautions: Fall;Other (comment) Precaution Comments: bladder/bowel incontinence Restrictions Weight Bearing Restrictions: No      Mobility Bed Mobility Overal bed mobility: Needs Assistance Bed Mobility: Supine to Sit     Supine to sit: Mod assist;+2 for physical assistance;+2 for safety/equipment     General bed mobility comments: Pt keeping eyes closed with minimal movement initiation despite cues, requiring modA+2 to  initiate movement, including trunk elevation and scooting hips to EOB; pt becoming more alert once sitting up    Transfers Overall transfer level: Needs assistance Equipment used: 1 person hand held assist;2 person hand held assist Transfers: Sit to/from Stand Sit to Stand: Min assist;+2 safety/equipment;Mod assist;+2 physical assistance         General transfer comment: Initial minA to stand from EOB (+2 safety), pt unaware of bladder/bowel incontinence upon standing; additional trials to/from [facility] and recliner, pt requiring modA+2 to elevate trunk; increased need for assist seems more related to cognitive impairment then physical weakness    Balance Overall balance assessment: Needs assistance   Sitting balance-Leahy Scale: Fair       Standing balance-Leahy Scale: Poor Standing balance comment: Reliant on UE support and external assist; dependent for pericare                           ADL either performed or assessed with clinical judgement   ADL Overall ADL's : Needs assistance/impaired Eating/Feeding: Minimal assistance;Sitting Eating/Feeding Details (indicate cue type and reason): assist to open containers and initiate self-feeding. pt then able to carryover task Grooming: Maximal assistance;Sitting                               Functional mobility during ADLs: Moderate assistance;+2 for physical assistance;+2 for safety/equipment (HHA) General ADL Comments: pt requiring max-totalA for remaining ADL today                         Pertinent Vitals/Pain Pain  Assessment: Faces Faces Pain Scale: Hurts a little bit Pain Location: Generalized, unable to specify Pain Descriptors / Indicators: Grimacing Pain Intervention(s): Monitored during session;Repositioned     Hand Dominance     Extremity/Trunk Assessment Upper Extremity Assessment Upper Extremity Assessment: Generalized weakness;Difficult to assess due to impaired cognition   Lower  Extremity Assessment Lower Extremity Assessment: Defer to PT evaluation;Difficult to assess due to impaired cognition       Communication Communication Communication: Expressive difficulties;Receptive difficulties   Cognition Arousal/Alertness: Lethargic;Awake/alert Behavior During Therapy: Flat affect Overall Cognitive Status: No family/caregiver present to determine baseline cognitive functioning                                 General Comments: Able to state first name, minimal verbalizations to answer questions beyond this. Stating "yes" to majority of MD's questions. Inconsistently following simple commands. Keeping eyes open more with upright mobility. At end of session states, "I don't think I'm doing good" in response to therapist's encouragement   General Comments  VSS on RA    Exercises     Shoulder Instructions      Home Living Family/patient expects to be discharged to:: Skilled nursing facility                                        Prior Functioning/Environment Level of Independence: Needs assistance        Comments: Pt poor historian. Per chart, recently recovered from pelvic fx, ambulating with RW and working with [place]. Daughter had found pt two days prior to admission with bag packed and wandering yard. HHPT reports AMS which is why EMS was called        OT Problem List: Decreased strength;Decreased range of motion;Decreased activity tolerance;Impaired balance (sitting and/or standing);Decreased cognition;Decreased safety awareness;Decreased knowledge of use of DME or AE      OT Treatment/Interventions: Self-care/ADL training;Therapeutic exercise;Energy conservation;DME and/or AE instruction;Therapeutic activities;Cognitive remediation/compensation;Patient/family education;Balance training    OT Goals(Current goals can be found in the care plan section) Acute Rehab OT Goals Patient Stated Goal: None stated OT Goal Formulation:  Patient unable to participate in goal setting Time For Goal Achievement: 01/01/21 Potential to Achieve Goals: Fair  OT Frequency: Min 2X/week   Barriers to D/C:            Co-evaluation PT/OT/SLP Co-Evaluation/Treatment: Yes Reason for Co-Treatment: Necessary to address cognition/behavior during functional activity;For patient/therapist safety;To address functional/ADL transfers PT goals addressed during session: Mobility/safety with mobility;Balance OT goals addressed during session: ADL's and self-care      AM-PAC OT "6 Clicks" Daily Activity     Outcome Measure Help from another person eating meals?: A Little Help from another person taking care of personal grooming?: A Lot Help from another person toileting, which includes using toliet, bedpan, or urinal?: Total Help from another person bathing (including washing, rinsing, drying)?: Total Help from another person to put on and taking off regular upper body clothing?: Total Help from another person to put on and taking off regular lower body clothing?: Total 6 Click Score: 9   End of Session Nurse Communication: Mobility status  Activity Tolerance: Patient tolerated treatment well Patient left: in chair;with call bell/phone within reach;with chair alarm set  OT Visit Diagnosis: Unsteadiness on feet (R26.81);Other symptoms and signs involving cognitive function;Muscle weakness (generalized) (M62.81)  Time: 2563-8937 OT Time Calculation (min): 32 min Charges:  OT General Charges $OT Visit: 1 Visit OT Evaluation $OT Eval Moderate Complexity: [place], OT Acute Rehabilitation Services Pager [phone] Office [phone]   Raymondo Band 12/18/2020, 12:59 PM

## 2020-12-18 NOTE — TOC Progression Note (Addendum)
Transition of Care [facility]) - Progression Note    Patient Details  Name: Tracey Allen MRN: [id number] Date of Birth: [birth year]  Transition of Care [facility]) CM/SW [place], LCSW Phone Number: 12/18/2020, 3:36 PM  Clinical Narrative:    3:36 PM CSW left voicemail for patient's daughter, Butch Penny, regarding discharge plan. Of note, no COVID SNF beds available at this time. Patient is currently active with [facility] home health.   3:58 PM CSW received return call from patient's daughter. She reported that patient was living with her at her home. She reported her current confusion is new and concerning to her. CSW discussed discharge options and recommendation of SNF placement. She reported that patient has been to [facility] and stated she would never return, even though that is a very good facility per daughter. CSW provided current COVID options once beds are available and she reported that she would bring patient back home; her sister is also coming down to stay with them. Patient has received the COVID vaccines. Patient has all needed DME (walker, cane, shower bar, stool, and 3in1).    Expected Discharge Plan: [place] Barriers to Discharge: Continued Medical Work up  Expected Discharge Plan and Services Expected Discharge Plan: [place] In-house Referral: Clinical Social Work Discharge Planning Services: CM Consult Post Acute Care Choice: Home Health (Is active with [facility]) Living arrangements for the past 2 months: Single Family Home                                       Social Determinants of Health (SDOH) Interventions    Readmission Risk Interventions No flowsheet data found.

## 2020-12-18 NOTE — Progress Notes (Addendum)
Pt admitted to room 5w; 3.  Tele connected, pt vitals taken.  Pt is not able to tell me her name or DOB but is alert and verbal.  Pt LR IVF restarted via patent R hand IV site.  Pt has mittens on to protect from pulling.  Patient given CHG bath and gown changed. Patient connected to new purewick system; working well. Call bell placed near patient and bed alarm is on.  Patient sent up w/ cardizem IV yet it had been stopped at 1700.  Will continue to monitor to evaluate whether there is a need to restart.

## 2020-12-18 NOTE — Plan of Care (Signed)
  Problem: Education: Goal: Knowledge of risk factors and measures for prevention of condition will improve Outcome: Not Progressing   Problem: Coping: Goal: Psychosocial and spiritual needs will be supported Outcome: Not Progressing   Problem: Respiratory: Goal: Will maintain a patent airway Outcome: Not Progressing Goal: Complications related to the disease process, condition or treatment will be avoided or minimized Outcome: Not Progressing   

## 2020-12-18 NOTE — Progress Notes (Addendum)
PROGRESS NOTE                                                                                                                                                                                                             Patient Demographics:    Tracey Allen, is a 87 y.o. female, DOB - 08-03-1925, ZRA:[id number]  Outpatient Primary MD for the patient is Nona Dell, Corene Cornea, MD    LOS - 3  Admit date - 12/15/2020    Chief Complaint  Patient presents with  . Altered Mental Status    Family states patient is "altered", MEDIC does not agree       Brief Narrative (HPI from H&P) Tracey Allen is a 87 y.o. female with medical history significant for hypertension, atrial fibrillation, CHF, CKD stage III, recent fall with pelvic fracture.  She was diagnosed with Covid a week specialization.  She has recently recovered from a fall with pelvic fracture, was currently at her home and was found to be confused by daughter walking in her backyard.  She was brought to the hospital where she was found to have COVID-19 pneumonia, dehydration with hypomagnesemia and AKI and was admitted.   Subjective:   Patient in bed working with PT, in no apparent distress, pleasantly confused, says yes to most of the questions.   Assessment  & Plan :     1. Acute Hypoxic Resp. Failure due to Acute Covid 19 Viral Pneumonitis during the ongoing 2020 Covid 19 Pandemic - she has received 2 doses of Vaccine , last > 6 mths ago, has mild Covid PNA with Toxic encephalopathy, she is on Stroids + Remdesivir , monitor.  Encouraged the patient to sit up in chair in the daytime use I-S and flutter valve for pulmonary toiletry and then prone in bed when at night.  Will advance activity and titrate down oxygen as possible.    SpO2: 94 %  Recent Labs  Lab 12/15/20 1242 12/15/20 2102 12/15/20 2102 12/15/20 2313 12/16/20 0613 12/16/20 0824 12/17/20 0631  12/18/20 0450  WBC 7.0  --   --   --  6.1  --  6.3 8.8  HGB 10.9*  --   --   --  10.6*  --  12.0 12.3  HCT 34.0*  --   --   --  31.5*  --  36.2 36.0  PLT 303  --   --   --  286  --  357 349  CRP  --  0.7   < > <0.5 0.6 0.7 0.7 0.6  BNP  --   --   --   --   --  173.1* 549.7* 390.1*  DDIMER  --   --   --  0.72*  --  0.85* 1.24* 0.94*  PROCALCITON  --  <0.10  --   --   --   --   --   --   AST 22  --   --   --   --   --  22 18  ALT 14  --   --   --   --   --  14 14  ALKPHOS 88  --   --   --   --   --  91 91  BILITOT 1.0  --   --   --   --   --  0.9 1.0  ALBUMIN 3.2*  --   --   --   --   --  3.2* 3.1*  SARSCOV2NAA  --  POSITIVE*  --   --   --   --   --   --    < > = values in this interval not displayed.    2. Dehydration with AKI and hypomagnesemia.  Improving after hydration with IV fluids and electrolyte replacement, hold home dose diuretic and ARB.  Also has underlying CKD Baseline creatinine appears to be close to 1.35.  3.  Underlying dementia with toxic encephalopathy.  Supportive care, minimize narcotics and benzodiazepines.  CT scan head Nonacute.  Encephalopathy is getting worse and can be life-threatening due to decreased oral intake, risk and for aspiration, daughter updated guarded prognosis, she is DNR.  Daughter would like to take her home with home health PT likely discharge on 12/19/2020  4.  Paroxysmal A. fib.  Mali vas 2 score of greater than 4.  On oral Xarelto and Cardizem, briefly required Cardizem drip for an episode of RVR on 12/16/2020.  5.  Essential hypertension.  She is currently on Cardizem will monitor.    Condition - Extremely Guarded  Family Communication  :  Daughter [phone] on 12/16/20, 12/17/20 - DNR, daughter 12/18/2020.  Wants to take her home  Code Status :  DNR  Consults  :  None  Procedures  :    CT Head - non acute  PUD Prophylaxis :    Disposition Plan  :    Status is: Inpatient  Remains inpatient appropriate because:IV treatments  appropriate due to intensity of illness or inability to take PO   Dispo: The patient is from: Home              Anticipated d/c is to: Home              Anticipated d/c date is: > 3 days              Patient currently is not medically stable to d/c.   DVT Prophylaxis  :  [place]  Lab Results  Component Value Date   PLT 349 12/18/2020    Diet :  Diet Order            Diet Heart Room service appropriate? Yes; Fluid consistency: Thin  Diet effective now                  Inpatient  Medications  Scheduled Meds: . dexamethasone (DECADRON) injection  6 mg Intravenous Q24H  . diltiazem  120 mg Oral Q8H  . DULoxetine  20 mg Oral Daily  . potassium chloride  10 mEq Oral Daily  . QUEtiapine  12.5 mg Oral BID  . Rivaroxaban  15 mg Oral Q breakfast   Continuous Infusions: . diltiazem (CARDIZEM) infusion Stopped (12/17/20 1716)  . remdesivir 100 mg in NS 100 mL 100 mg (12/18/20 0845)   PRN Meds:.acetaminophen, albuterol, guaiFENesin-dextromethorphan, haloperidol lactate, senna-docusate  Antibiotics  :    Anti-infectives (From admission, onward)   Start     Dose/Rate Route Frequency Ordered Stop   12/16/20 1000  remdesivir 100 mg in sodium chloride 0.9 % 100 mL IVPB  Status:  Discontinued       "Followed by" Linked Group Details   100 mg 200 mL/hr over 30 Minutes Intravenous Daily 12/15/20 2244 12/15/20 2248   12/16/20 1000  remdesivir 100 mg in sodium chloride 0.9 % 100 mL IVPB       "Followed by" Linked Group Details   100 mg 200 mL/hr over 30 Minutes Intravenous Daily 12/15/20 2150 12/20/20 0959   12/15/20 2200  remdesivir 200 mg in sodium chloride 0.9% 250 mL IVPB       "Followed by" Linked Group Details   200 mg 580 mL/hr over 30 Minutes Intravenous Once 12/15/20 2150 12/15/20 2302   12/15/20 2145  remdesivir 200 mg in sodium chloride 0.9% 250 mL IVPB  Status:  Discontinued       "Followed by" Linked Group Details   200 mg 580 mL/hr over 30 Minutes Intravenous Once  12/15/20 2244 12/15/20 2248       Time Spent in minutes  30   Lala Lund M.D on 12/18/2020 at 9:22 AM  To page go to [email or web address]   [facility] -  Office  [phone]  See all Orders from today for further details    Objective:   Vitals:   12/18/20 0217 12/18/20 0346 12/18/20 0514 12/18/20 0631  BP: (!) 157/102  101/70 129/65  Pulse: (!) 104   (!) 104  Resp: 19   17  Temp: 98.4 F (36.9 C)   98.2 F (36.8 C)  TempSrc: Axillary   Axillary  SpO2: 97% 100%  94%  Weight:      Height:        Wt Readings from Last 3 Encounters:  12/15/20 68.4 kg  03/12/20 68.4 kg  01/06/20 66.2 kg     Intake/Output Summary (Last 24 hours) at 12/18/2020 4193 Last data filed at 12/17/2020 1159 Gross per 24 hour  Intake 100 ml  Output -  Net 100 ml     Physical Exam  Awake but pleasantly confused, no focal deficits [place].AT,PERRAL Supple Neck,No JVD, No cervical lymphadenopathy appriciated.  Symmetrical Chest wall movement, Good air movement bilaterally, CTAB RRR,No Gallops, Rubs or new Murmurs, No Parasternal Heave +ve B.Sounds, Abd Soft, No tenderness, No organomegaly appriciated, No rebound - guarding or rigidity. No Cyanosis, Clubbing or edema, No new Rash or bruise     Data Review:    CBC Recent Labs  Lab 12/15/20 1242 12/16/20 0613 12/17/20 0631 12/18/20 0450  WBC 7.0 6.1 6.3 8.8  HGB 10.9* 10.6* 12.0 12.3  HCT 34.0* 31.5* 36.2 36.0  PLT 303 286 357 349  MCV 93.9 94.0 91.6 91.8  MCH 30.1 31.6 30.4 31.4  MCHC 32.1 33.7 33.1 34.2  RDW 12.3 12.4 12.4 12.6  LYMPHSABS  --  2.0 1.5 1.4  MONOABS  --  0.5 0.3 0.4  EOSABS  --  0.1 0.0 0.0  BASOSABS  --  0.0 0.0 0.0    Recent Labs  Lab 12/15/20 1242 12/15/20 2102 12/15/20 2102 12/15/20 2313 12/16/20 0613 12/16/20 0824 12/17/20 0631 12/18/20 0450  NA 137  --   --   --   --  139 140 141  K 3.4*  --   --   --   --  3.7 4.4 4.2  CL 101  --   --   --   --  104 104 108  CO2 24  --   --   --   --  22 22  23   GLUCOSE 114*  --   --   --   --  94 148* 140*  BUN 18  --   --   --   --  17 24* 28*  CREATININE 1.75*  --   --   --   --  1.56* 1.47* 1.49*  CALCIUM 8.9  --   --   --   --  8.9 9.6 9.3  AST 22  --   --   --   --   --  22 18  ALT 14  --   --   --   --   --  14 14  ALKPHOS 88  --   --   --   --   --  91 91  BILITOT 1.0  --   --   --   --   --  0.9 1.0  ALBUMIN 3.2*  --   --   --   --   --  3.2* 3.1*  MG  --   --   --   --   --  1.6* 2.1 2.2  CRP  --  0.7   < > <0.5 0.6 0.7 0.7 0.6  DDIMER  --   --   --  0.72*  --  0.85* 1.24* 0.94*  PROCALCITON  --  <0.10  --   --   --   --   --   --   BNP  --   --   --   --   --  173.1* 549.7* 390.1*   < > = values in this interval not displayed.    ------------------------------------------------------------------------------------------------------------------ Recent Labs    12/15/20 2102  TRIG 69    No results found for: HGBA1C ------------------------------------------------------------------------------------------------------------------ No results for input(s): TSH, T4TOTAL, T3FREE, THYROIDAB in the last 72 hours.  Invalid input(s): FREET3  Cardiac Enzymes No results for input(s): CKMB, TROPONINI, MYOGLOBIN in the last 168 hours.  Invalid input(s): CK ------------------------------------------------------------------------------------------------------------------    Component Value Date/Time   BNP 390.1 (H) 12/18/2020 0450    Micro Results Recent Results (from the past 240 hour(s))  Resp Panel by RT-PCR (Flu A&B, Covid) Nasopharyngeal Swab     Status: Abnormal   Collection Time: 12/15/20  9:02 PM   Specimen: Nasopharyngeal Swab; Nasopharyngeal(NP) swabs in vial transport medium  Result Value Ref Range Status   SARS Coronavirus 2 by RT PCR POSITIVE (A) NEGATIVE Final    Comment: RESULT CALLED TO, READ BACK BY AND VERIFIED WITH: Clarene Critchley RN 12/15/20 AT 2220 SK  (NOTE) SARS-CoV-2 target nucleic acids are DETECTED.  The  SARS-CoV-2 RNA is generally detectable in upper respiratory specimens during the acute phase of infection. Positive results are indicative of the presence of the identified virus, but do  not rule out bacterial infection or co-infection with other pathogens not detected by the test. Clinical correlation with patient history and other diagnostic information is necessary to determine patient infection status. The expected result is Negative.  Fact Sheet for Patients: EntrepreneurPulse.com.au  Fact Sheet for Healthcare Providers: IncredibleEmployment.be  This test is not yet approved or cleared by the [place] FDA and  has been authorized for detection and/or diagnosis of SARS-CoV-2 by FDA under an Emergency Use Authorization (EUA).  This EUA will remain in effect (meaning this test can be  used) for the duration of  the COVID-19 declaration under Section 564(b)(1) of the Act, 21 U.S.C. section 360bbb-3(b)(1), unless the authorization is terminated or revoked sooner.     Influenza A by PCR NEGATIVE NEGATIVE Final   Influenza B by PCR NEGATIVE NEGATIVE Final    Comment: (NOTE) The Xpert Xpress SARS-CoV-2/FLU/RSV plus assay is intended as an aid in the diagnosis of influenza from Nasopharyngeal swab specimens and should not be used as a sole basis for treatment. Nasal washings and aspirates are unacceptable for Xpert Xpress SARS-CoV-2/FLU/RSV testing.  Fact Sheet for Patients: EntrepreneurPulse.com.au  Fact Sheet for Healthcare Providers: IncredibleEmployment.be  This test is not yet approved or cleared by the [place] FDA and has been authorized for detection and/or diagnosis of SARS-CoV-2 by FDA under an Emergency Use Authorization (EUA). This EUA will remain in effect (meaning this test can be used) for the duration of the COVID-19 declaration under Section 564(b)(1) of the Act, 21 U.S.C. section  360bbb-3(b)(1), unless the authorization is terminated or revoked.  Performed at [facility], [address], [place], [place] [address]     Radiology Reports CT HEAD WO CONTRAST  Result Date: 12/15/2020 CLINICAL DATA:  Mental status changes EXAM: CT HEAD WITHOUT CONTRAST TECHNIQUE: Contiguous axial images were obtained from the base of the skull through the vertex without intravenous contrast. COMPARISON:  11/15/2020 FINDINGS: Brain: There is atrophy and chronic small vessel disease changes. No acute intracranial abnormality. Specifically, no hemorrhage, hydrocephalus, mass lesion, acute infarction, or significant intracranial injury. Vascular: No hyperdense vessel or unexpected calcification. Skull: No acute calvarial abnormality. Sinuses/Orbits: No acute findings Other: None IMPRESSION: Atrophy, chronic microvascular disease. No acute intracranial abnormality. Electronically Signed   By: Rolm Baptise M.D.   On: 12/15/2020 14:13   DG Chest Port 1 View  Result Date: 12/18/2020 CLINICAL DATA:  Shortness of breath.  COVID-19 positive EXAM: PORTABLE CHEST 1 VIEW COMPARISON:  December 15, 2020 FINDINGS: There has been partial clearing of ill-defined opacity from the right mid lung. Patchy airspace opacity in the left mid lung and left base regions persists. No new opacity evident. Heart mildly enlarged with pulmonary vascularity normal. There is aortic atherosclerosis. No adenopathy. Degenerative change noted in the right shoulder. IMPRESSION: Partial clearing of ill-defined airspace opacity from the right mid lung region. Essentially stable of find areas of opacity left mid lung and left base. Suspect multifocal pneumonia, likely of atypical organism etiology. There may be a degree of underlying chronic bronchitis. No new opacity evident. Stable cardiac prominence. No adenopathy. Aortic Atherosclerosis (ICD10-I70.0). Electronically Signed   By: Lowella Grip III M.D.   On: 12/18/2020 09:03   DG  Chest Port 1 View  Result Date: 12/15/2020 CLINICAL DATA:  Cough, COVID EXAM: PORTABLE CHEST 1 VIEW COMPARISON:  07/02/2020 FINDINGS: Cardiomegaly. Patchy bilateral airspace opacities. Diffuse interstitial prominence, likely underlying chronic lung disease. No effusions or pneumothorax. IMPRESSION: Patchy bilateral airspace opacities superimposed on  chronic lung disease. Findings concerning for multifocal pneumonia. Cardiomegaly. Electronically Signed   By: Rolm Baptise M.D.   On: 12/15/2020 14:10

## 2020-12-19 DIAGNOSIS — U071 COVID-19: Secondary | ICD-10-CM | POA: Diagnosis not present

## 2020-12-19 DIAGNOSIS — J1282 Pneumonia due to coronavirus disease 2019: Secondary | ICD-10-CM | POA: Diagnosis not present

## 2020-12-19 LAB — COMPREHENSIVE METABOLIC PANEL
ALT: 22 U/L (ref 0–44)
AST: 25 U/L (ref 15–41)
Albumin: 3.1 g/dL — ABNORMAL LOW (ref 3.5–5.0)
Alkaline Phosphatase: 87 U/L (ref 38–126)
Anion gap: 10 (ref 5–15)
BUN: 41 mg/dL — ABNORMAL HIGH (ref 8–23)
CO2: 22 mmol/L (ref 22–32)
Calcium: 9 mg/dL (ref 8.9–10.3)
Chloride: 107 mmol/L (ref 98–111)
Creatinine, Ser: 1.82 mg/dL — ABNORMAL HIGH (ref 0.44–1.00)
GFR, Estimated: 26 mL/min — ABNORMAL LOW (ref >60)
Glucose, Bld: 138 mg/dL — ABNORMAL HIGH (ref 70–99)
Potassium: 4.5 mmol/L (ref 3.5–5.1)
Sodium: 139 mmol/L (ref 135–145)
Total Bilirubin: 0.7 mg/dL (ref 0.3–1.2)
Total Protein: 6.5 g/dL (ref 6.5–8.1)

## 2020-12-19 LAB — C-REACTIVE PROTEIN: CRP: 0.6 mg/dL (ref <1.0)

## 2020-12-19 LAB — CBC WITH DIFFERENTIAL/PLATELET
Abs Immature Granulocytes: 0.09 10*3/uL — ABNORMAL HIGH (ref 0.00–0.07)
Basophils Absolute: 0 10*3/uL (ref 0.0–0.1)
Basophils Relative: 0 %
Eosinophils Absolute: 0 10*3/uL (ref 0.0–0.5)
Eosinophils Relative: 0 %
HCT: 36.4 % (ref 36.0–46.0)
Hemoglobin: 12 g/dL (ref 12.0–15.0)
Immature Granulocytes: 1 %
Lymphocytes Relative: 11 %
Lymphs Abs: 1.3 10*3/uL (ref 0.7–4.0)
MCH: 30.2 pg (ref 26.0–34.0)
MCHC: 33 g/dL (ref 30.0–36.0)
MCV: 91.7 fL (ref 80.0–100.0)
Monocytes Absolute: 0.5 10*3/uL (ref 0.1–1.0)
Monocytes Relative: 4 %
Neutro Abs: 10.4 10*3/uL — ABNORMAL HIGH (ref 1.7–7.7)
Neutrophils Relative %: 84 %
Platelets: 357 10*3/uL (ref 150–400)
RBC: 3.97 MIL/uL (ref 3.87–5.11)
RDW: 12.5 % (ref 11.5–15.5)
WBC: 12.3 10*3/uL — ABNORMAL HIGH (ref 4.0–10.5)
nRBC: 0 % (ref 0.0–0.2)

## 2020-12-19 LAB — MAGNESIUM: Magnesium: 2.4 mg/dL (ref 1.7–2.4)

## 2020-12-19 LAB — BRAIN NATRIURETIC PEPTIDE: B Natriuretic Peptide: 236.4 pg/mL — ABNORMAL HIGH (ref 0.0–100.0)

## 2020-12-19 LAB — D-DIMER, QUANTITATIVE: D-Dimer, Quant: 0.84 ug/mL-FEU — ABNORMAL HIGH (ref 0.00–0.50)

## 2020-12-19 MED ORDER — LOPERAMIDE HCL 2 MG PO CAPS
2.0000 mg | ORAL_CAPSULE | Freq: Four times a day (QID) | ORAL | Status: DC | PRN
  Administered 2020-12-19 – 2020-12-20 (×3): 2 mg via ORAL
  Filled 2020-12-19 (×3): qty 1

## 2020-12-19 MED ORDER — FUROSEMIDE 10 MG/ML IJ SOLN: 40.0000 mg | Freq: Once | INTRAMUSCULAR | Status: DC

## 2020-12-19 MED ORDER — FUROSEMIDE 10 MG/ML IJ SOLN
60.0000 mg | Freq: Once | INTRAMUSCULAR | Status: AC
  Administered 2020-12-19: 60 mg via INTRAVENOUS
  Filled 2020-12-19: qty 6

## 2020-12-19 MED ORDER — LACTATED RINGERS IV SOLN: INTRAVENOUS | Status: DC

## 2020-12-19 NOTE — Progress Notes (Cosign Needed)
Hospital bed: Length of Need 6 Months  The above medical condition requires: Patient requires the ability to reposition frequently  Head must be elevated greater than: 30 degrees  Bed type Semi-electric  Hoyer Lift Yes  Support Surface: Gel Overlay    Wheelchair:  ePatient suffers from weakness which impairs their ability to perform daily activities like ambulating, dressing in the home. A walker will not resolve  issue with performing activities of daily living. A wheelchair will allow patient to safely perform daily activities. Patient is not able to propel themselves in the home using a standard weight wheelchair due to weakness. Patient can self propel in the lightweight wheelchair. Length of need lifetime.  Accessories: elevating leg rests (ELRs), wheel locks, extensions and anti-tippers.

## 2020-12-19 NOTE — TOC Progression Note (Signed)
Transition of Care [facility]) - Progression Note    Patient Details  Name: Tracey Allen MRN: [id number] Date of Birth: 1925-10-19  Transition of Care [facility]) CM/SW [place], LCSW Phone Number: 12/19/2020, 4:21 PM  Clinical Narrative:    CSW received call from patient's daughter requesting a wheelchair and hospital bed. RNCM assisting with DME delivery. She is also requesting PTAR for transport (tomorrow after 2:30pm).   Expected Discharge Plan: [place] Barriers to Discharge: Continued Medical Work up  Expected Discharge Plan and Services Expected Discharge Plan: [place] In-house Referral: Clinical Social Work Discharge Planning Services: CM Consult Post Acute Care Choice: Home Health (Is active with [facility]) Living arrangements for the past 2 months: Single Family Home                                       Social Determinants of Health (SDOH) Interventions    Readmission Risk Interventions No flowsheet data found.

## 2020-12-19 NOTE — Progress Notes (Addendum)
Physical Therapy Treatment Patient Details Name: Tracey Allen MRN: [id number] DOB: 06-Dec-1925 Today's Date: 12/19/2020    History of Present Illness Pt is a 87 y.o. female admitted from home on 12/15/20 after found to be confused by daughter. Workup for acute hypoxic respiratory failure due to COVID-19 PNA, dehydration, AKI, underlying dementia with toxic encephalopathy. Head CT negative for acute abnormality. PMH includes recent pelvic fx due to fall, HTN, afib, CHF, CKD III.   PT Comments    Pt not progressing with mobility this session; remains limited by worsening AMS and actively resisting majority of mobility attempts. Pt able to perform bouts of bed mobility and prolonged sitting EOB, requiring consistent modA. Pt demonstrates significant core/BUE/BLE strength, but difficult to progress OOB this session due to cognitive status, as well as dislodged IV and bowel incontinence. Continue to recommend SNF-level therapies to maximize functional mobility and decrease caregiver burden. Per chart, family plans to have pt return home.   Follow Up Recommendations  SNF;Supervision/Assistance - 24 hour     Equipment Recommendations  Wheelchair (measurements PT);Wheelchair cushion (measurements PT)    Recommendations for Other Services       Precautions / Restrictions Precautions Precautions: Fall;Other (comment) Precaution Comments: bladder/bowel incontinence; bilateral soft mitts Restrictions Weight Bearing Restrictions: No    Mobility  Bed Mobility Overal bed mobility: Needs Assistance Bed Mobility: Supine to Sit;Sit to Supine;Rolling Rolling: Mod assist;+2 for safety/equipment   Supine to sit: Mod assist Sit to supine: Mod assist   General bed mobility comments: Pt keeping eyes closed with minimal movement initiation despite cues, requiring modA to initiate movement to sit EOB, then pt scooting hips forward independently; modA for return to supine due to bowel incontinence;  rolling R/L for pericare/washup with modA (+2 for pericare/washup). Pt coming to sit almost full long sitting from flat bed without assist or UE support  Transfers                 General transfer comment: Deferred secondary to pt actively resisting movement and bowel incontinence; return to supine for washup and RN to replace IV  Ambulation/Gait                 Stairs             Wheelchair Mobility    Modified Rankin (Stroke Patients Only)       Balance Overall balance assessment: Needs assistance   Sitting balance-Leahy Scale: Fair Sitting balance - Comments: Prolonged sitting edge of bed without UE support, min guard                                    Cognition Arousal/Alertness: Lethargic Behavior During Therapy: Flat affect;Agitated Overall Cognitive Status: No family/caregiver present to determine baseline cognitive functioning                                 General Comments: Pt keeping eyes almost closed majority of session despite mobilizing, actively resisting all movement majority of session (especially with attempts to put pressure on dislodged IV site to stop bleeding); no appropriate verbalizations; soft, mumbling speech, sounds like pt is saying, "mama, are they through?"      Exercises      General Comments General comments (skin integrity, edema, etc.): RN present and aware of IV dislodged; NT present to assist with return to  supine and washup      Pertinent Vitals/Pain Pain Assessment: Faces Faces Pain Scale: Hurts a little bit Pain Location: Generalized; grimacing when RN attempting IV site cleaning; actively resisting all movement Pain Descriptors / Indicators: Grimacing;Guarding Pain Intervention(s): Monitored during session;Repositioned    Home Living                      Prior Function            PT Goals (current goals can now be found in the care plan section) Progress towards PT  goals: Not progressing toward goals - comment (cognitive impairment, resisting activity/agitation)    Frequency    Min 3X/week      PT Plan Current plan remains appropriate    Co-evaluation              AM-PAC PT "6 Clicks" Mobility   Outcome Measure  Help needed turning from your back to your side while in a flat bed without using bedrails?: A Lot Help needed moving from lying on your back to sitting on the side of a flat bed without using bedrails?: A Lot Help needed moving to and from a bed to a chair (including a wheelchair)?: A Lot Help needed standing up from a chair using your arms (e.g., wheelchair or bedside chair)?: A Lot Help needed to walk in hospital room?: Total Help needed climbing 3-5 steps with a railing? : Total 6 Click Score: 10    End of Session   Activity Tolerance: Treatment limited secondary to agitation;Patient limited by fatigue Patient left: in bed;with call bell/phone within reach;with bed alarm set;with nursing/sitter in room Nurse Communication: Mobility status PT Visit Diagnosis: Other abnormalities of gait and mobility (R26.89);Muscle weakness (generalized) (M62.81)     Time: 1245-1310 PT Time Calculation (min) (ACUTE ONLY): 25 min  Charges:  $Therapeutic Activity: 8-22 mins                     Mabeline Caras, PT, DPT Acute Rehabilitation Services  Pager [phone] Office [place] 12/19/2020, 1:57 PM

## 2020-12-19 NOTE — Plan of Care (Signed)
  Problem: Respiratory: Goal: Will maintain a patent airway Outcome: Progressing Goal: Complications related to the disease process, condition or treatment will be avoided or minimized Outcome: Progressing   Problem: Safety: Goal: Ability to remain free from injury will improve Outcome: Progressing   Problem: Education: Goal: Knowledge of risk factors and measures for prevention of condition will improve Outcome: Not Progressing   Problem: Coping: Goal: Psychosocial and spiritual needs will be supported Outcome: Not Progressing   Problem: Education: Goal: Knowledge of General Education information will improve Description: Including pain rating scale, medication(s)/side effects and non-pharmacologic comfort measures Outcome: Not Progressing   Problem: Health Behavior/Discharge Planning: Goal: Ability to manage health-related needs will improve Outcome: Not Progressing   Problem: Activity: Goal: Risk for activity intolerance will decrease Outcome: Not Progressing   Problem: Nutrition: Goal: Adequate nutrition will be maintained Outcome: Not Progressing

## 2020-12-19 NOTE — Progress Notes (Signed)
PROGRESS NOTE                                                                                                                                                                                                             Patient Demographics:    Tracey Allen, is a 87 y.o. female, DOB - 01/16/1925, GEZ:[id number]  Outpatient Primary MD for the patient is Nona Dell, Corene Cornea, MD    LOS - 4  Admit date - 12/15/2020    Chief Complaint  Patient presents with  . Altered Mental Status    Family states patient is "altered", MEDIC does not agree       Brief Narrative (HPI from H&P) Tracey Allen is a 87 y.o. female with medical history significant for hypertension, atrial fibrillation, CHF, CKD stage III, recent fall with pelvic fracture.  She was diagnosed with Covid a week specialization.  She has recently recovered from a fall with pelvic fracture, was currently at her home and was found to be confused by daughter walking in her backyard.  She was brought to the hospital where she was found to have COVID-19 pneumonia, dehydration with hypomagnesemia and AKI and was admitted.   Subjective:   Patient in bed, appears to be more tiered today, unreliable historian due to confusion.   Assessment  & Plan :     1. Acute Hypoxic Resp. Failure due to Acute Covid 19 Viral Pneumonitis during the ongoing 2020 Covid 19 Pandemic - she has received 2 doses of Vaccine , last > 6 mths ago, has mild Covid PNA with Toxic encephalopathy, she is on Stroids + Remdesivir , monitor.  Encouraged the patient to sit up in chair in the daytime use I-S and flutter valve for pulmonary toiletry and then prone in bed when at night.  Will advance activity and titrate down oxygen as possible.   SpO2: 97 %  Recent Labs  Lab 12/15/20 1242 12/15/20 2102 12/15/20 2102 12/15/20 2313 12/16/20 6503 12/16/20 0824 12/17/20 0631 12/18/20 0450 12/19/20 0055  WBC  7.0  --   --   --  6.1  --  6.3 8.8 12.3*  HGB 10.9*  --   --   --  10.6*  --  12.0 12.3 12.0  HCT 34.0*  --   --   --  31.5*  --  36.2 36.0 36.4  PLT 303  --   --   --  286  --  357 349 357  CRP  --  0.7   < > <0.5 0.6 0.7 0.7 0.6 0.6  BNP  --   --   --   --   --  173.1* 549.7* 390.1* 236.4*  DDIMER  --   --   --  0.72*  --  0.85* 1.24* 0.94* 0.84*  PROCALCITON  --  <0.10  --   --   --   --   --   --   --   AST 22  --   --   --   --   --  22 18 25   ALT 14  --   --   --   --   --  14 14 22   ALKPHOS 88  --   --   --   --   --  91 91 87  BILITOT 1.0  --   --   --   --   --  0.9 1.0 0.7  ALBUMIN 3.2*  --   --   --   --   --  3.2* 3.1* 3.1*  SARSCOV2NAA  --  POSITIVE*  --   --   --   --   --   --   --    < > = values in this interval not displayed.    2. Dehydration with AKI and hypomagnesemia.   hold home dose diuretic and ARB.  Also has underlying CKD Baseline creatinine appears to be close to 1.35, worse today, IVF and monitor.  3.  Underlying dementia with toxic encephalopathy.  Supportive care, minimize narcotics and benzodiazepines.  CT scan head Nonacute.  Encephalopathy is getting worse and can be life-threatening due to decreased oral intake, risk and for aspiration, daughter updated guarded prognosis, she is DNR.  Daughter would like to take her home with home health PT likely discharge on 12/20/2020  4.  Paroxysmal A. fib.  Mali vas 2 score of greater than 4.  On oral Xarelto and Cardizem, briefly required Cardizem drip for an episode of RVR on 12/16/2020.  5.  Essential hypertension.  She is currently on Cardizem will monitor.     Condition - Extremely Guarded  Family Communication  :  Daughter [phone] on 12/16/20, 12/17/20 - DNR, daughter 12/18/2020.  Wants to take her home  Code Status :  DNR  Consults  :  None  Procedures  :    CT Head - non acute  PUD Prophylaxis :    Disposition Plan  :    Status is: Inpatient  Remains inpatient appropriate because:IV treatments  appropriate due to intensity of illness or inability to take PO   Dispo: The patient is from: Home              Anticipated d/c is to: Home              Anticipated d/c date is: > 3 days              Patient currently is not medically stable to d/c.   DVT Prophylaxis  :  Xaralto  Lab Results  Component Value Date   PLT 357 12/19/2020    Diet :  Diet Order            Diet Heart Room service appropriate? Yes; Fluid consistency: Thin  Diet effective now  Inpatient Medications  Scheduled Meds: . diltiazem  120 mg Oral Q8H  . DULoxetine  20 mg Oral Daily  . polyethylene glycol  8.5 g Oral Daily  . QUEtiapine  12.5 mg Oral BID  . Rivaroxaban  15 mg Oral Q breakfast   Continuous Infusions: . lactated ringers     PRN Meds:.acetaminophen, albuterol, guaiFENesin-dextromethorphan, haloperidol lactate, loperamide, senna-docusate  Antibiotics  :    Anti-infectives (From admission, onward)   Start     Dose/Rate Route Frequency Ordered Stop   12/16/20 1000  remdesivir 100 mg in sodium chloride 0.9 % 100 mL IVPB  Status:  Discontinued       "Followed by" Linked Group Details   100 mg 200 mL/hr over 30 Minutes Intravenous Daily 12/15/20 2244 12/15/20 2248   12/16/20 1000  remdesivir 100 mg in sodium chloride 0.9 % 100 mL IVPB       "Followed by" Linked Group Details   100 mg 200 mL/hr over 30 Minutes Intravenous Daily 12/15/20 2150 12/19/20 0900   12/15/20 2200  remdesivir 200 mg in sodium chloride 0.9% 250 mL IVPB       "Followed by" Linked Group Details   200 mg 580 mL/hr over 30 Minutes Intravenous Once 12/15/20 2150 12/15/20 2302   12/15/20 2145  remdesivir 200 mg in sodium chloride 0.9% 250 mL IVPB  Status:  Discontinued       "Followed by" Linked Group Details   200 mg 580 mL/hr over 30 Minutes Intravenous Once 12/15/20 2244 12/15/20 2248       Time Spent in minutes  30   Lala Lund M.D on 12/19/2020 at 11:45 AM  To page go to [email or web address]    [facility] -  Office  [phone]  See all Orders from today for further details    Objective:   Vitals:   12/18/20 1338 12/18/20 1520 12/18/20 2108 12/19/20 0505  BP: 96/72 (!) 114/55 (!) 117/92 (!) 117/92  Pulse:  77 81 67  Resp:  (!) 24 18 18   Temp:  98.5 F (36.9 C) 98.2 F (36.8 C) 97.6 F (36.4 C)  TempSrc:  Axillary Oral Axillary  SpO2:  99% 95% 97%  Weight:      Height:        Wt Readings from Last 3 Encounters:  12/15/20 68.4 kg  03/12/20 68.4 kg  01/06/20 66.2 kg     Intake/Output Summary (Last 24 hours) at 12/19/2020 1145 Last data filed at 12/19/2020 0900 Gross per 24 hour  Intake 440 ml  Output --  Net 440 ml     Physical Exam  Awake but severely confused today, appears weaker, [place].AT,PERRAL Supple Neck,No JVD, No cervical lymphadenopathy appriciated.  Symmetrical Chest wall movement, Good air movement bilaterally, CTAB RRR,No Gallops, Rubs or new Murmurs, No Parasternal Heave +ve B.Sounds, Abd Soft, No tenderness, No organomegaly appriciated, No rebound - guarding or rigidity. No Cyanosis, Clubbing or edema, No new Rash or bruise     Data Review:    CBC Recent Labs  Lab 12/15/20 1242 12/16/20 0613 12/17/20 0631 12/18/20 0450 12/19/20 0055  WBC 7.0 6.1 6.3 8.8 12.3*  HGB 10.9* 10.6* 12.0 12.3 12.0  HCT 34.0* 31.5* 36.2 36.0 36.4  PLT 303 286 357 349 357  MCV 93.9 94.0 91.6 91.8 91.7  MCH 30.1 31.6 30.4 31.4 30.2  MCHC 32.1 33.7 33.1 34.2 33.0  RDW 12.3 12.4 12.4 12.6 12.5  LYMPHSABS  --  2.0 1.5 1.4 1.3  MONOABS  --  0.5 0.3 0.4 0.5  EOSABS  --  0.1 0.0 0.0 0.0  BASOSABS  --  0.0 0.0 0.0 0.0    Recent Labs  Lab 12/15/20 1242 12/15/20 2102 12/15/20 2102 12/15/20 2313 12/16/20 5397 12/16/20 0824 12/17/20 0631 12/18/20 0450 12/19/20 0055  NA 137  --   --   --   --  139 140 141 139  K 3.4*  --   --   --   --  3.7 4.4 4.2 4.5  CL 101  --   --   --   --  104 104 108 107  CO2 24  --   --   --   --  22 22 23 22    GLUCOSE 114*  --   --   --   --  94 148* 140* 138*  BUN 18  --   --   --   --  17 24* 28* 41*  CREATININE 1.75*  --   --   --   --  1.56* 1.47* 1.49* 1.82*  CALCIUM 8.9  --   --   --   --  8.9 9.6 9.3 9.0  AST 22  --   --   --   --   --  22 18 25   ALT 14  --   --   --   --   --  14 14 22   ALKPHOS 88  --   --   --   --   --  91 91 87  BILITOT 1.0  --   --   --   --   --  0.9 1.0 0.7  ALBUMIN 3.2*  --   --   --   --   --  3.2* 3.1* 3.1*  MG  --   --   --   --   --  1.6* 2.1 2.2 2.4  CRP  --  0.7   < > <0.5 0.6 0.7 0.7 0.6 0.6  DDIMER  --   --   --  0.72*  --  0.85* 1.24* 0.94* 0.84*  PROCALCITON  --  <0.10  --   --   --   --   --   --   --   BNP  --   --   --   --   --  173.1* 549.7* 390.1* 236.4*   < > = values in this interval not displayed.    ------------------------------------------------------------------------------------------------------------------ No results for input(s): CHOL, HDL, LDLCALC, TRIG, CHOLHDL, LDLDIRECT in the last 72 hours.  No results found for: HGBA1C ------------------------------------------------------------------------------------------------------------------ No results for input(s): TSH, T4TOTAL, T3FREE, THYROIDAB in the last 72 hours.  Invalid input(s): FREET3  Cardiac Enzymes No results for input(s): CKMB, TROPONINI, MYOGLOBIN in the last 168 hours.  Invalid input(s): CK ------------------------------------------------------------------------------------------------------------------    Component Value Date/Time   BNP 236.4 (H) 12/19/2020 0055    Micro Results Recent Results (from the past 240 hour(s))  Resp Panel by RT-PCR (Flu A&B, Covid) Nasopharyngeal Swab     Status: Abnormal   Collection Time: 12/15/20  9:02 PM   Specimen: Nasopharyngeal Swab; Nasopharyngeal(NP) swabs in vial transport medium  Result Value Ref Range Status   SARS Coronavirus 2 by RT PCR POSITIVE (A) NEGATIVE Final    Comment: RESULT CALLED TO, READ BACK BY AND  VERIFIED WITH: Clarene Critchley RN 12/15/20 AT 2220 SK  (NOTE) SARS-CoV-2 target nucleic acids are DETECTED.  The SARS-CoV-2 RNA is generally detectable in upper respiratory  specimens during the acute phase of infection. Positive results are indicative of the presence of the identified virus, but do not rule out bacterial infection or co-infection with other pathogens not detected by the test. Clinical correlation with patient history and other diagnostic information is necessary to determine patient infection status. The expected result is Negative.  Fact Sheet for Patients: EntrepreneurPulse.com.au  Fact Sheet for Healthcare Providers: IncredibleEmployment.be  This test is not yet approved or cleared by the [place] FDA and  has been authorized for detection and/or diagnosis of SARS-CoV-2 by FDA under an Emergency Use Authorization (EUA).  This EUA will remain in effect (meaning this test can be  used) for the duration of  the COVID-19 declaration under Section 564(b)(1) of the Act, 21 U.S.C. section 360bbb-3(b)(1), unless the authorization is terminated or revoked sooner.     Influenza A by PCR NEGATIVE NEGATIVE Final   Influenza B by PCR NEGATIVE NEGATIVE Final    Comment: (NOTE) The Xpert Xpress SARS-CoV-2/FLU/RSV plus assay is intended as an aid in the diagnosis of influenza from Nasopharyngeal swab specimens and should not be used as a sole basis for treatment. Nasal washings and aspirates are unacceptable for Xpert Xpress SARS-CoV-2/FLU/RSV testing.  Fact Sheet for Patients: EntrepreneurPulse.com.au  Fact Sheet for Healthcare Providers: IncredibleEmployment.be  This test is not yet approved or cleared by the [place] FDA and has been authorized for detection and/or diagnosis of SARS-CoV-2 by FDA under an Emergency Use Authorization (EUA). This EUA will remain in effect (meaning this test can  be used) for the duration of the COVID-19 declaration under Section 564(b)(1) of the Act, 21 U.S.C. section 360bbb-3(b)(1), unless the authorization is terminated or revoked.  Performed at [facility], [place] [address], [place], [place] [address]     Radiology Reports CT HEAD WO CONTRAST  Result Date: 12/15/2020 CLINICAL DATA:  Mental status changes EXAM: CT HEAD WITHOUT CONTRAST TECHNIQUE: Contiguous axial images were obtained from the base of the skull through the vertex without intravenous contrast. COMPARISON:  11/15/2020 FINDINGS: Brain: There is atrophy and chronic small vessel disease changes. No acute intracranial abnormality. Specifically, no hemorrhage, hydrocephalus, mass lesion, acute infarction, or significant intracranial injury. Vascular: No hyperdense vessel or unexpected calcification. Skull: No acute calvarial abnormality. Sinuses/Orbits: No acute findings Other: None IMPRESSION: Atrophy, chronic microvascular disease. No acute intracranial abnormality. Electronically Signed   By: Rolm Baptise M.D.   On: 12/15/2020 14:13   DG Chest Port 1 View  Result Date: 12/18/2020 CLINICAL DATA:  Shortness of breath.  COVID-19 positive EXAM: PORTABLE CHEST 1 VIEW COMPARISON:  December 15, 2020 FINDINGS: There has been partial clearing of ill-defined opacity from the right mid lung. Patchy airspace opacity in the left mid lung and left base regions persists. No new opacity evident. Heart mildly enlarged with pulmonary vascularity normal. There is aortic atherosclerosis. No adenopathy. Degenerative change noted in the right shoulder. IMPRESSION: Partial clearing of ill-defined airspace opacity from the right mid lung region. Essentially stable of find areas of opacity left mid lung and left base. Suspect multifocal pneumonia, likely of atypical organism etiology. There may be a degree of underlying chronic bronchitis. No new opacity evident. Stable cardiac prominence. No adenopathy. Aortic  Atherosclerosis (ICD10-I70.0). Electronically Signed   By: Lowella Grip III M.D.   On: 12/18/2020 09:03   DG Chest Port 1 View  Result Date: 12/15/2020 CLINICAL DATA:  Cough, COVID EXAM: PORTABLE CHEST 1 VIEW COMPARISON:  07/02/2020 FINDINGS: Cardiomegaly. Patchy bilateral airspace  opacities. Diffuse interstitial prominence, likely underlying chronic lung disease. No effusions or pneumothorax. IMPRESSION: Patchy bilateral airspace opacities superimposed on chronic lung disease. Findings concerning for multifocal pneumonia. Cardiomegaly. Electronically Signed   By: Rolm Baptise M.D.   On: 12/15/2020 14:10

## 2020-12-20 DIAGNOSIS — U071 COVID-19: Secondary | ICD-10-CM | POA: Diagnosis not present

## 2020-12-20 DIAGNOSIS — J1282 Pneumonia due to coronavirus disease 2019: Secondary | ICD-10-CM | POA: Diagnosis not present

## 2020-12-20 LAB — COMPREHENSIVE METABOLIC PANEL
ALT: 42 U/L (ref 0–44)
AST: 41 U/L (ref 15–41)
Albumin: 2.9 g/dL — ABNORMAL LOW (ref 3.5–5.0)
Alkaline Phosphatase: 84 U/L (ref 38–126)
Anion gap: 14 (ref 5–15)
BUN: 61 mg/dL — ABNORMAL HIGH (ref 8–23)
CO2: 21 mmol/L — ABNORMAL LOW (ref 22–32)
Calcium: 8.7 mg/dL — ABNORMAL LOW (ref 8.9–10.3)
Chloride: 108 mmol/L (ref 98–111)
Creatinine, Ser: 2.12 mg/dL — ABNORMAL HIGH (ref 0.44–1.00)
GFR, Estimated: 21 mL/min — ABNORMAL LOW (ref >60)
Glucose, Bld: 170 mg/dL — ABNORMAL HIGH (ref 70–99)
Potassium: 4.1 mmol/L (ref 3.5–5.1)
Sodium: 143 mmol/L (ref 135–145)
Total Bilirubin: 0.6 mg/dL (ref 0.3–1.2)
Total Protein: 6 g/dL — ABNORMAL LOW (ref 6.5–8.1)

## 2020-12-20 LAB — CBC WITH DIFFERENTIAL/PLATELET
Abs Immature Granulocytes: 0.11 10*3/uL — ABNORMAL HIGH (ref 0.00–0.07)
Basophils Absolute: 0 10*3/uL (ref 0.0–0.1)
Basophils Relative: 0 %
Eosinophils Absolute: 0 10*3/uL (ref 0.0–0.5)
Eosinophils Relative: 0 %
HCT: 38 % (ref 36.0–46.0)
Hemoglobin: 12.7 g/dL (ref 12.0–15.0)
Immature Granulocytes: 1 %
Lymphocytes Relative: 10 %
Lymphs Abs: 1.9 10*3/uL (ref 0.7–4.0)
MCH: 31.1 pg (ref 26.0–34.0)
MCHC: 33.4 g/dL (ref 30.0–36.0)
MCV: 93.1 fL (ref 80.0–100.0)
Monocytes Absolute: 1.3 10*3/uL — ABNORMAL HIGH (ref 0.1–1.0)
Monocytes Relative: 7 %
Neutro Abs: 15.5 10*3/uL — ABNORMAL HIGH (ref 1.7–7.7)
Neutrophils Relative %: 82 %
Platelets: 366 10*3/uL (ref 150–400)
RBC: 4.08 MIL/uL (ref 3.87–5.11)
RDW: 12.7 % (ref 11.5–15.5)
WBC: 18.9 10*3/uL — ABNORMAL HIGH (ref 4.0–10.5)
nRBC: 0 % (ref 0.0–0.2)

## 2020-12-20 LAB — C-REACTIVE PROTEIN: CRP: 1 mg/dL — ABNORMAL HIGH (ref <1.0)

## 2020-12-20 LAB — MAGNESIUM: Magnesium: 2.4 mg/dL (ref 1.7–2.4)

## 2020-12-20 LAB — D-DIMER, QUANTITATIVE: D-Dimer, Quant: 0.63 ug/mL-FEU — ABNORMAL HIGH (ref 0.00–0.50)

## 2020-12-20 LAB — BRAIN NATRIURETIC PEPTIDE: B Natriuretic Peptide: 214.1 pg/mL — ABNORMAL HIGH (ref 0.0–100.0)

## 2020-12-20 MED ORDER — BUMETANIDE 2 MG PO TABS: 2.0000 mg | ORAL_TABLET | Freq: Every morning | ORAL | Status: AC

## 2020-12-20 MED ORDER — POTASSIUM CHLORIDE ER 10 MEQ PO TBCR: 10.0000 meq | EXTENDED_RELEASE_TABLET | Freq: Every day | ORAL | Status: AC

## 2020-12-20 MED ORDER — LACTATED RINGERS IV SOLN: INTRAVENOUS | Status: AC

## 2020-12-20 MED ORDER — HYDRALAZINE HCL 50 MG PO TABS: 50.0000 mg | ORAL_TABLET | Freq: Three times a day (TID) | ORAL | 0 refills | Status: AC

## 2020-12-20 MED ORDER — HYDRALAZINE HCL 50 MG PO TABS
50.0000 mg | ORAL_TABLET | Freq: Three times a day (TID) | ORAL | Status: DC
  Administered 2020-12-20: 50 mg via ORAL
  Filled 2020-12-20: qty 1

## 2020-12-20 MED ORDER — ALBUTEROL SULFATE HFA 108 (90 BASE) MCG/ACT IN AERS: 2.0000 | INHALATION_SPRAY | RESPIRATORY_TRACT | 0 refills | Status: AC | PRN

## 2020-12-20 NOTE — Discharge Instructions (Signed)
Follow with Primary MD Nona Dell, Corene Cornea, MD in 7 days   Get CBC, CMP, 2 view Chest X ray -  checked next visit within 1 week by Primary MD    Activity: As tolerated with Full fall precautions use walker/cane & assistance as needed  Disposition Home    Diet: Soft diet with feeding assistance and aspiration precautions.  Special Instructions: If you have smoked or chewed Tobacco  in the last 2 yrs please stop smoking, stop any regular Alcohol  and or any Recreational drug use.  On your next visit with your primary care physician please Get Medicines reviewed and adjusted.  Please request your Prim.MD to go over all Hospital Tests and Procedure/Radiological results at the follow up, please get all Hospital records sent to your Prim MD by signing hospital release before you go home.  If you experience worsening of your admission symptoms, develop shortness of breath, life threatening emergency, suicidal or homicidal thoughts you must seek medical attention immediately by calling 911 or calling your MD immediately  if symptoms less severe.  You Must read complete instructions/literature along with all the possible adverse reactions/side effects for all the Medicines you take and that have been prescribed to you. Take any new Medicines after you have completely understood and accpet all the possible adverse reactions/side effects.       Person Under Monitoring Name: SADEE Allen  Location: [address] [place] [address]   Infection Prevention Recommendations for Individuals Confirmed to have, or Being Evaluated for, 2019 Novel Coronavirus (COVID-19) Infection Who Receive Care at Home  Individuals who are confirmed to have, or are being evaluated for, COVID-19 should follow the prevention steps below until a healthcare provider or local or state health department says they can return to normal activities.  Stay home except to get medical care You should restrict activities  outside your home, except for getting medical care. Do not go to work, school, or public areas, and do not use public transportation or taxis.  Call ahead before visiting your doctor Before your medical appointment, call the healthcare provider and tell them that you have, or are being evaluated for, COVID-19 infection. This will help the healthcare providers office take steps to keep other people from getting infected. Ask your healthcare provider to call the local or state health department.  Monitor your symptoms Seek prompt medical attention if your illness is worsening (e.g., difficulty breathing). Before going to your medical appointment, call the healthcare provider and tell them that you have, or are being evaluated for, COVID-19 infection. Ask your healthcare provider to call the local or state health department.  Wear a facemask You should wear a facemask that covers your nose and mouth when you are in the same room with other people and when you visit a healthcare provider. People who live with or visit you should also wear a facemask while they are in the same room with you.  Separate yourself from other people in your home As much as possible, you should stay in a different room from other people in your home. Also, you should use a separate bathroom, if available.  Avoid sharing household items You should not share dishes, drinking glasses, cups, eating utensils, towels, bedding, or other items with other people in your home. After using these items, you should wash them thoroughly with soap and water.  Cover your coughs and sneezes Cover your mouth and nose with a tissue when you cough or sneeze,  or you can cough or sneeze into your sleeve. Throw used tissues in a lined trash can, and immediately wash your hands with soap and water for at least 20 seconds or use an alcohol-based hand rub.  Wash your [facility] your hands often and thoroughly with soap and water for at  least 20 seconds. You can use an alcohol-based hand sanitizer if soap and water are not available and if your hands are not visibly dirty. Avoid touching your eyes, nose, and mouth with unwashed hands.   Prevention Steps for Caregivers and Household Members of Individuals Confirmed to have, or Being Evaluated for, COVID-19 Infection Being Cared for in the Home  If you live with, or provide care at home for, a person confirmed to have, or being evaluated for, COVID-19 infection please follow these guidelines to prevent infection:  Follow healthcare providers instructions Make sure that you understand and can help the patient follow any healthcare provider instructions for all care.  Provide for the patients basic needs You should help the patient with basic needs in the home and provide support for getting groceries, prescriptions, and other personal needs.  Monitor the patients symptoms If they are getting sicker, call his or her medical provider and tell them that the patient has, or is being evaluated for, COVID-19 infection. This will help the healthcare providers office take steps to keep other people from getting infected. Ask the healthcare provider to call the local or state health department.  Limit the number of people who have contact with the patient  If possible, have only one caregiver for the patient.  Other household members should stay in another home or place of residence. If this is not possible, they should stay  in another room, or be separated from the patient as much as possible. Use a separate bathroom, if available.  Restrict visitors who do not have an essential need to be in the home.  Keep older adults, very young children, and other sick people away from the patient Keep older adults, very young children, and those who have compromised immune systems or chronic health conditions away from the patient. This includes people with chronic heart, lung, or  kidney conditions, diabetes, and cancer.  Ensure good ventilation Make sure that shared spaces in the home have good air flow, such as from an air conditioner or an opened window, weather permitting.  Wash your hands often  Wash your hands often and thoroughly with soap and water for at least 20 seconds. You can use an alcohol based hand sanitizer if soap and water are not available and if your hands are not visibly dirty.  Avoid touching your eyes, nose, and mouth with unwashed hands.  Use disposable paper towels to dry your hands. If not available, use dedicated cloth towels and replace them when they become wet.  Wear a facemask and gloves  Wear a disposable facemask at all times in the room and gloves when you touch or have contact with the patients blood, body fluids, and/or secretions or excretions, such as sweat, saliva, sputum, nasal mucus, vomit, urine, or feces.  Ensure the mask fits over your nose and mouth tightly, and do not touch it during use.  Throw out disposable facemasks and gloves after using them. Do not reuse.  Wash your hands immediately after removing your facemask and gloves.  If your personal clothing becomes contaminated, carefully remove clothing and launder. Wash your hands after handling contaminated clothing.  Place all used disposable  facemasks, gloves, and other waste in a lined container before disposing them with other household waste.  Remove gloves and wash your hands immediately after handling these items.  Do not share dishes, glasses, or other household items with the patient  Avoid sharing household items. You should not share dishes, drinking glasses, cups, eating utensils, towels, bedding, or other items with a patient who is confirmed to have, or being evaluated for, COVID-19 infection.  After the person uses these items, you should wash them thoroughly with soap and water.  Wash laundry thoroughly  Immediately remove and wash clothes  or bedding that have blood, body fluids, and/or secretions or excretions, such as sweat, saliva, sputum, nasal mucus, vomit, urine, or feces, on them.  Wear gloves when handling laundry from the patient.  Read and follow directions on labels of laundry or clothing items and detergent. In general, wash and dry with the warmest temperatures recommended on the label.  Clean all areas the individual has used often  Clean all touchable surfaces, such as counters, tabletops, doorknobs, bathroom fixtures, toilets, phones, keyboards, tablets, and bedside tables, every day. Also, clean any surfaces that may have blood, body fluids, and/or secretions or excretions on them.  Wear gloves when cleaning surfaces the patient has come in contact with.  Use a diluted bleach solution (e.g., dilute bleach with 1 part bleach and 10 parts water) or a household disinfectant with a label that says EPA-registered for coronaviruses. To make a bleach solution at home, add 1 tablespoon of bleach to 1 quart (4 cups) of water. For a larger supply, add  cup of bleach to 1 gallon (16 cups) of water.  Read labels of cleaning products and follow recommendations provided on product labels. Labels contain instructions for safe and effective use of the cleaning product including precautions you should take when applying the product, such as wearing gloves or eye protection and making sure you have good ventilation during use of the product.  Remove gloves and wash hands immediately after cleaning.  Monitor yourself for signs and symptoms of illness Caregivers and household members are considered close contacts, should monitor their health, and will be asked to limit movement outside of the home to the extent possible. Follow the monitoring steps for close contacts listed on the symptom monitoring form.   ? If you have additional questions, contact your local health department or call the epidemiologist on call at  [phone] (available 24/7). ? This guidance is subject to change. For the most up-to-date guidance from [facility], please refer to their website: [email or web address]

## 2020-12-20 NOTE — Progress Notes (Signed)
Tracey Allen to be D/C'd home per MD order. Discussed with the patient's daughter Butch Penny and all questions fully answered.  Skin clean, dry and intact without evidence of skin break down, no evidence of skin tears noted.  IV catheter discontinued intact. Site without signs and symptoms of complications. Dressing and pressure applied.  An After Visit Summary was printed and given to the patient.  Patient escorted via stretcher, and D/C home via [facility].  [name]

## 2020-12-20 NOTE — Care Management (Addendum)
[facility] called for patient. All equipment at house ( bed and wheelchair)

## 2020-12-20 NOTE — Progress Notes (Signed)
   12/20/20 0829  Assess: MEWS Score  Level of Consciousness Responds to Pain  Assess: MEWS Score  MEWS Temp 0  MEWS Systolic 0  MEWS Pulse 0  MEWS RR 0  MEWS LOC 2  MEWS Score 2  MEWS Score Color Yellow  Assess: if the MEWS score is Yellow or Red  Were vital signs taken at a resting state? Yes  Focused Assessment No change from prior assessment  Early Detection of Sepsis Score *See Row Information* Low  MEWS guidelines implemented *See Row Information* Yes  Treat  MEWS Interventions Escalated (See documentation below)  Diarrhea relieved by Antidiarrheal  Take Vital Signs  Increase Vital Sign Frequency  Yellow: Q 2hr X 2 then Q 4hr X 2, if remains yellow, continue Q 4hrs  Escalate  MEWS: Escalate Yellow: discuss with charge nurse/RN and consider discussing with provider and RRT  Notify: Charge Nurse/RN  Name of Charge Nurse/RN Notified Erin, RN  Date Charge Nurse/RN Notified 12/20/20  Time Charge Nurse/RN Notified 0900

## 2020-12-20 NOTE — Discharge Summary (Signed)
Tracey Allen PNT:[id number] DOB: 1925-05-24 DOA: 12/15/2020  PCP: Townsend Roger, MD  Admit date: 12/15/2020  Discharge date: 12/20/2020  Admitted From: Home   Disposition:  Home   Recommendations for Outpatient Follow-up:   Follow up with PCP in 1-2 weeks  PCP Please obtain BMP/CBC, 2 view CXR in 1week,  (see Discharge instructions)   PCP Please follow up on the following pending results: Check CBC, CMP 1-2 weeks   Home Health: PT, RN, SW  Equipment/Devices: Meadow.Bed, Wheelchair with cushion Consultations: None   Discharge Condition: Guarded   CODE STATUS: DNR   Diet Recommendation: Soft  Chief Complaint  Patient presents with  . Altered Mental Status    Family states patient is "altered", MEDIC does not agree     Brief history of present illness from the day of admission and additional interim summary    Tracey K Cranfordis a 87 y.o.femalewith medical history significant forhypertension, atrial fibrillation, CHF, CKD stage III, recent fall with pelvic fracture.She was diagnosed with Covid a week specialization.  She has recently recovered from a fall with pelvic fracture, was currently at her home and was found to be confused by daughter walking in her backyard.  She was brought to the hospital where she was found to have COVID-19 pneumonia, dehydration with hypomagnesemia and AKI and was admitted.                                                                 Hospital Course   1. Acute Hypoxic Resp. Failure due to Acute Covid 19 Viral Pneumonitis during the ongoing 2020 Covid 19 Pandemic - she has received 2 doses of Vaccine , last > 6 mths ago, had mild Covid PNA with Toxic encephalopathy, she was treated with short course of steroids and Remdesivir with improvement in her hypoxia, she is now on room  air and not hypoxic however her encephalopathy is the main issue now and could be life-threatening at her age.  Kindly see #3 below.    SpO2: 100 %  Recent Labs  Lab 12/15/20 1242 12/15/20 2102 12/15/20 2313 12/16/20 0867 12/16/20 0824 12/17/20 0631 12/18/20 0450 12/19/20 0055 12/20/20 0033 12/20/20 0309  WBC 7.0  --   --  6.1  --  6.3 8.8 12.3* 18.9*  --   CRP  --  0.7   < > 0.6 0.7 0.7 0.6 0.6 1.0*  --   DDIMER  --   --    < >  --  0.85* 1.24* 0.94* 0.84*  --  0.63*  BNP  --   --   --   --  173.1* 549.7* 390.1* 236.4* 214.1*  --   PROCALCITON  --  <0.10  --   --   --   --   --   --   --   --  AST 22  --   --   --   --  '22 18 25 ' 41  --   ALT 14  --   --   --   --  '14 14 22 ' 42  --   ALKPHOS 88  --   --   --   --  91 91 87 84  --   BILITOT 1.0  --   --   --   --  0.9 1.0 0.7 0.6  --   ALBUMIN 3.2*  --   --   --   --  3.2* 3.1* 3.1* 2.9*  --   SARSCOV2NAA  --  POSITIVE*  --   --   --   --   --   --   --   --    < > = values in this interval not displayed.      2. Dehydration with AKI and hypomagnesemia.  Her baseline creatinine is around 1.4, diuretics held, gently hydrated, PCP to repeat CMP in 7 to 10 days, holding her diuretics for 5 more days and discontinuing her ARB upon discharge.  3.  Underlying dementia with toxic encephalopathy.  Supportive care, minimize narcotics and benzodiazepines.  CT scan head Nonacute.  Encephalopathy has gotten worse due to underlying COVID-19 inflammation and she appears to be going downhill fast, this could be a life-threatening event, she is not eating or drinking, had detailed discussion with patient's daughters, if she stays in the hospital most likely she will pass away in a few days, will try to discharge her home in the hope that her mentation will improve and she will increase her oral intake, if she declines family has been requested to initiate home hospice.  Will be discharged home with social work.  4.  Paroxysmal A. fib.  Mali vas  2 score of greater than 4.  On oral Xarelto and Cardizem, briefly required Cardizem drip for an episode of RVR on 12/16/2020.  5.  Essential hypertension. On Cardizem and hydralazine, ARB stopped.   Discharge diagnosis     Principal Problem:   Pneumonia due to COVID-19 virus Active Problems:   Chronic anticoagulation   Chronic atrial fibrillation [facility])   Essential hypertension   Weakness    Discharge instructions    Discharge Instructions    Discharge instructions   Complete by: As directed    Follow with Primary MD Nona Dell, Corene Cornea, MD in 7 days   Get CBC, CMP, 2 view Chest X ray -  checked next visit within 1 week by Primary MD    Activity: As tolerated with Full fall precautions use walker/cane & assistance as needed  Disposition Home    Diet: Soft diet with feeding assistance and aspiration precautions.  Special Instructions: If you have smoked or chewed Tobacco  in the last 2 yrs please stop smoking, stop any regular Alcohol  and or any Recreational drug use.  On your next visit with your primary care physician please Get Medicines reviewed and adjusted.  Please request your Prim.MD to go over all Hospital Tests and Procedure/Radiological results at the follow up, please get all Hospital records sent to your Prim MD by signing hospital release before you go home.  If you experience worsening of your admission symptoms, develop shortness of breath, life threatening emergency, suicidal or homicidal thoughts you must seek medical attention immediately by calling 911 or calling your MD immediately  if symptoms less severe.  You Must read complete instructions/literature  along with all the possible adverse reactions/side effects for all the Medicines you take and that have been prescribed to you. Take any new Medicines after you have completely understood and accpet all the possible adverse reactions/side effects.   Increase activity slowly   Complete by: As directed        Discharge Medications   Allergies as of 12/20/2020      Reactions   Amoxicillin Other (See Comments)   Blisters   Norvasc [amlodipine Besylate] Other (See Comments)   Unknown reaction; pt currently taking AZOR   Tape Other (See Comments)   SKIN IS VERY THIN!!   Beta Adrenergic Blockers Rash   Codeine Rash   Penicillins Rash      Medication List    STOP taking these medications   Dilt-XR 120 MG 24 hr capsule Generic drug: diltiazem   hydrochlorothiazide 25 MG tablet Commonly known as: HYDRODIURIL   olmesartan 20 MG tablet Commonly known as: BENICAR   sulfamethoxazole-trimethoprim 800-160 MG tablet Commonly known as: BACTRIM DS     TAKE these medications   acetaminophen 500 MG tablet Commonly known as: TYLENOL Take 1,000 mg by mouth every 8 (eight) hours as needed for mild pain or headache.   albuterol 108 (90 Base) MCG/ACT inhaler Commonly known as: VENTOLIN HFA Inhale 2 puffs into the lungs every 4 (four) hours as needed for wheezing or shortness of breath.   bumetanide 2 MG tablet Commonly known as: BUMEX Take 1 tablet (2 mg total) by mouth in the morning. Start taking on: December 24, 2020 What changed: These instructions start on December 24, 2020. If you are unsure what to do until then, ask your doctor or other care provider.   CALCIUM+D3 PO Take 1 tablet by mouth daily with breakfast.   Cyanocobalamin 1000 MCG/ML Kit Inject 1,000 mcg into the skin every 30 (thirty) days.   diltiazem 180 MG 24 hr capsule Commonly known as: CARDIZEM CD TAKE ONE CAPSULE BY MOUTH DAILY What changed: when to take this   DULoxetine 20 MG capsule Commonly known as: CYMBALTA Take 20 mg by mouth daily.   hydrALAZINE 50 MG tablet Commonly known as: APRESOLINE Take 1 tablet (50 mg total) by mouth every 8 (eight) hours.   omeprazole 20 MG capsule Commonly known as: PRILOSEC TAKE ONE CAPSULE BY MOUTH DAILY and TAKE ONE CAPSULE BY MOUTH DAILY AT 8 PM What changed: See the  new instructions.   oxyCODONE-acetaminophen 5-325 MG tablet Commonly known as: PERCOCET/ROXICET Take 1 tablet by mouth 3 (three) times daily.   polyethylene glycol powder 17 GM/SCOOP powder Commonly known as: GLYCOLAX/MIRALAX Take 8.5 g by mouth daily as needed for mild constipation (MIX AND DRINK).   potassium chloride 10 MEQ tablet Commonly known as: KLOR-CON Take 1 tablet (10 mEq total) by mouth daily. Start taking on: December 24, 2020 What changed: These instructions start on December 24, 2020. If you are unsure what to do until then, ask your doctor or other care provider.   PreserVision AREDS Caps Take 1 capsule by mouth 2 (two) times daily.   Xarelto 15 MG Tabs tablet Generic drug: Rivaroxaban TAKE ONE TABLET BY MOUTH DAILY What changed:   how much to take  when to take this            Durable Medical Equipment  (From admission, onward)         Start     Ordered   12/19/20 1559  For home use only DME Hospital bed  Once       Question Answer Comment  Length of Need 6 Months   The above medical condition requires: Patient requires the ability to reposition frequently   Head must be elevated greater than: 30 degrees   Bed type Semi-electric   Hoyer Lift Yes   Support Surface: Gel Overlay      12/19/20 1558   12/19/20 1558  For home use only DME lightweight manual wheelchair with seat cushion  Once       Comments: ePatient suffers from weakness which impairs their ability to perform daily activities like ambulating, dressing in the home.  A walker will not resolve  issue with performing activities of daily living. A wheelchair will allow patient to safely perform daily activities. Patient is not able to propel themselves in the home using a standard weight wheelchair due to weakness. Patient can self propel in the lightweight wheelchair. Length of need lifetime. Accessories: elevating leg rests (ELRs), wheel locks, extensions and anti-tippers.   12/19/20 1558            Follow-up Information    Townsend Roger, MD. Schedule an appointment as soon as possible for a visit in 1 week(s).   Specialty: Internal Medicine Contact information: [place] [address] [phone]               Major procedures and Radiology Reports - PLEASE review detailed and final reports thoroughly  -       CT HEAD WO CONTRAST  Result Date: 12/15/2020 CLINICAL DATA:  Mental status changes EXAM: CT HEAD WITHOUT CONTRAST TECHNIQUE: Contiguous axial images were obtained from the base of the skull through the vertex without intravenous contrast. COMPARISON:  11/15/2020 FINDINGS: Brain: There is atrophy and chronic small vessel disease changes. No acute intracranial abnormality. Specifically, no hemorrhage, hydrocephalus, mass lesion, acute infarction, or significant intracranial injury. Vascular: No hyperdense vessel or unexpected calcification. Skull: No acute calvarial abnormality. Sinuses/Orbits: No acute findings Other: None IMPRESSION: Atrophy, chronic microvascular disease. No acute intracranial abnormality. Electronically Signed   By: Rolm Baptise M.D.   On: 12/15/2020 14:13   DG Chest Port 1 View  Result Date: 12/18/2020 CLINICAL DATA:  Shortness of breath.  COVID-19 positive EXAM: PORTABLE CHEST 1 VIEW COMPARISON:  December 15, 2020 FINDINGS: There has been partial clearing of ill-defined opacity from the right mid lung. Patchy airspace opacity in the left mid lung and left base regions persists. No new opacity evident. Heart mildly enlarged with pulmonary vascularity normal. There is aortic atherosclerosis. No adenopathy. Degenerative change noted in the right shoulder. IMPRESSION: Partial clearing of ill-defined airspace opacity from the right mid lung region. Essentially stable of find areas of opacity left mid lung and left base. Suspect multifocal pneumonia, likely of atypical organism etiology. There may be a degree of underlying chronic  bronchitis. No new opacity evident. Stable cardiac prominence. No adenopathy. Aortic Atherosclerosis (ICD10-I70.0). Electronically Signed   By: Lowella Grip III M.D.   On: 12/18/2020 09:03   DG Chest Port 1 View  Result Date: 12/15/2020 CLINICAL DATA:  Cough, COVID EXAM: PORTABLE CHEST 1 VIEW COMPARISON:  07/02/2020 FINDINGS: Cardiomegaly. Patchy bilateral airspace opacities. Diffuse interstitial prominence, likely underlying chronic lung disease. No effusions or pneumothorax. IMPRESSION: Patchy bilateral airspace opacities superimposed on chronic lung disease. Findings concerning for multifocal pneumonia. Cardiomegaly. Electronically Signed   By: Rolm Baptise M.D.   On: 12/15/2020 14:10    Micro Results  Recent Results (from the past 240 hour(s))  Resp Panel by RT-PCR (Flu A&B, Covid) Nasopharyngeal Swab     Status: Abnormal   Collection Time: 12/15/20  9:02 PM   Specimen: Nasopharyngeal Swab; Nasopharyngeal(NP) swabs in vial transport medium  Result Value Ref Range Status   SARS Coronavirus 2 by RT PCR POSITIVE (A) NEGATIVE Final    Comment: RESULT CALLED TO, READ BACK BY AND VERIFIED WITH: Clarene Critchley RN 12/15/20 AT 2220 SK  (NOTE) SARS-CoV-2 target nucleic acids are DETECTED.  The SARS-CoV-2 RNA is generally detectable in upper respiratory specimens during the acute phase of infection. Positive results are indicative of the presence of the identified virus, but do not rule out bacterial infection or co-infection with other pathogens not detected by the test. Clinical correlation with patient history and other diagnostic information is necessary to determine patient infection status. The expected result is Negative.  Fact Sheet for Patients: EntrepreneurPulse.com.au  Fact Sheet for Healthcare Providers: IncredibleEmployment.be  This test is not yet approved or cleared by the [place] FDA and  has been authorized for detection and/or  diagnosis of SARS-CoV-2 by FDA under an Emergency Use Authorization (EUA).  This EUA will remain in effect (meaning this test can be  used) for the duration of  the COVID-19 declaration under Section 564(b)(1) of the Act, 21 U.S.C. section 360bbb-3(b)(1), unless the authorization is terminated or revoked sooner.     Influenza A by PCR NEGATIVE NEGATIVE Final   Influenza B by PCR NEGATIVE NEGATIVE Final    Comment: (NOTE) The Xpert Xpress SARS-CoV-2/FLU/RSV plus assay is intended as an aid in the diagnosis of influenza from Nasopharyngeal swab specimens and should not be used as a sole basis for treatment. Nasal washings and aspirates are unacceptable for Xpert Xpress SARS-CoV-2/FLU/RSV testing.  Fact Sheet for Patients: EntrepreneurPulse.com.au  Fact Sheet for Healthcare Providers: IncredibleEmployment.be  This test is not yet approved or cleared by the [place] FDA and has been authorized for detection and/or diagnosis of SARS-CoV-2 by FDA under an Emergency Use Authorization (EUA). This EUA will remain in effect (meaning this test can be used) for the duration of the COVID-19 declaration under Section 564(b)(1) of the Act, 21 U.S.C. section 360bbb-3(b)(1), unless the authorization is terminated or revoked.  Performed at [facility], [place] [address], [place], [place] [address]     Today   Subjective    Tracey Allen today remains in bed appears very weak and lethargic, unable to answer questions or follow commands reliably.   Objective   Blood pressure (!) 154/70, pulse 66, temperature 98 F (36.7 C), temperature source Axillary, resp. rate 18, height '5\' 2"'  (1.575 m), weight 68.4 kg, SpO2 100 %.   Intake/Output Summary (Last 24 hours) at 12/20/2020 1012 Last data filed at 12/20/2020 0300 Gross per 24 hour  Intake 1478.35 ml  Output 2 ml  Net 1476.35 ml    Exam  Somnolent, appears weak, unable to follow commands  reliably or answer questions [place].AT,PERRAL Supple Neck,No JVD, No cervical lymphadenopathy appriciated.  Symmetrical Chest wall movement, Good air movement bilaterally, CTAB RRR,No Gallops,Rubs or new Murmurs, No Parasternal Heave +ve B.Sounds, Abd Soft, Non tender, No organomegaly appriciated, No rebound -guarding or rigidity. No Cyanosis, Clubbing or edema, No new Rash or bruise   Data Review   CBC w Diff:  Lab Results  Component Value Date   WBC 18.9 (H) 12/20/2020   HGB 12.7 12/20/2020   HGB 13.2 01/06/2020   HCT  38.0 12/20/2020   HCT 39.1 01/06/2020   PLT 366 12/20/2020   PLT 258 01/06/2020   LYMPHOPCT 10 12/20/2020   MONOPCT 7 12/20/2020   EOSPCT 0 12/20/2020   BASOPCT 0 12/20/2020    CMP:  Lab Results  Component Value Date   NA 143 12/20/2020   NA 145 (H) 01/06/2020   K 4.1 12/20/2020   CL 108 12/20/2020   CO2 21 (L) 12/20/2020   BUN 61 (H) 12/20/2020   BUN 16 01/06/2020   CREATININE 2.12 (H) 12/20/2020   PROT 6.0 (L) 12/20/2020   ALBUMIN 2.9 (L) 12/20/2020   BILITOT 0.6 12/20/2020   ALKPHOS 84 12/20/2020   AST 41 12/20/2020   ALT 42 12/20/2020  .   Total Time in preparing paper work, data evaluation and todays exam - 36 minutes  Lala Lund M.D on 12/20/2020 at 10:12 AM  [facility]

## 2021-01-05 IMAGING — CT CT HEAD W/O CM
3 series · 15 of 47 positions shown, 18 images · non-contrast
Comparison: 11/15/2020

CLINICAL DATA: Mental status changes

EXAM:
CT HEAD WITHOUT CONTRAST
TECHNIQUE: Contiguous axial images were obtained from the base of the skull
through the vertex without intravenous contrast.

[Series 3: head 5.0 h30s · axial · 0.43mm/px · z∈[-116,+14]mm · 9 of 32 slices shown, 12 images]
[im 3/32  brain]
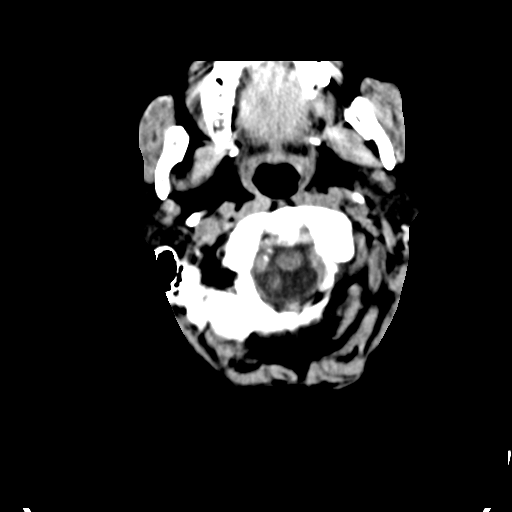
[im 3/32  bone]
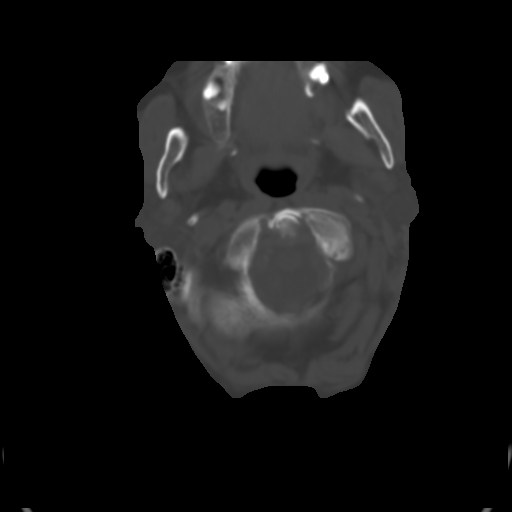
[im 6/32  brain]
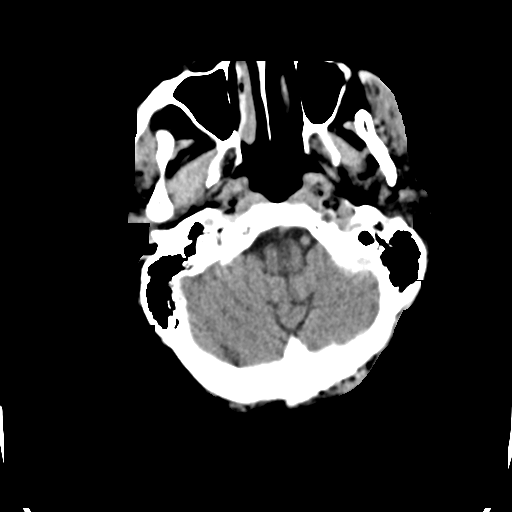
[im 9/32  brain]
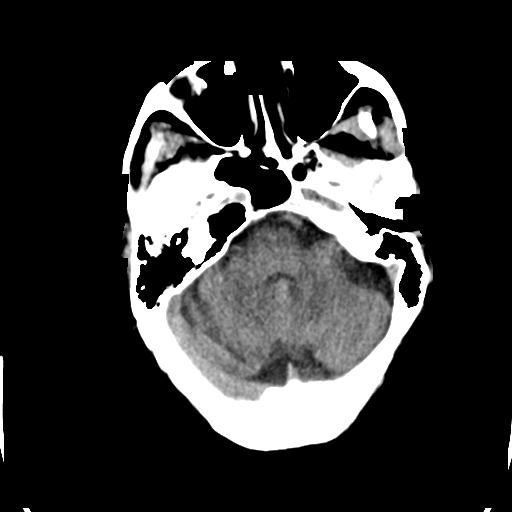
[im 12/32  brain]
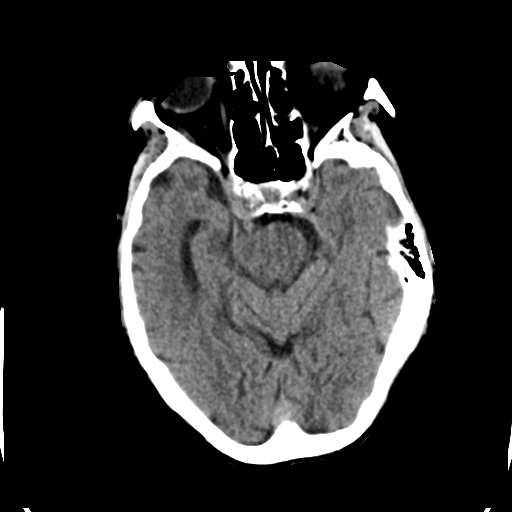
[im 17/32  brain]
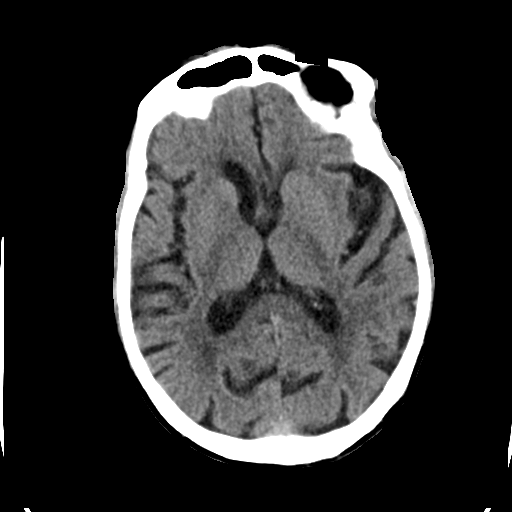
[im 17/32  bone]
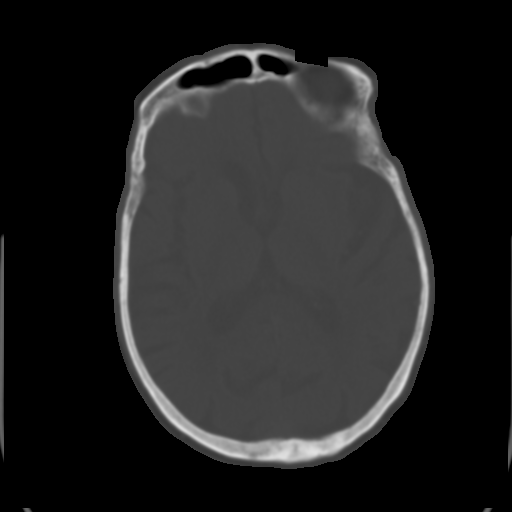
[im 20/32  brain]
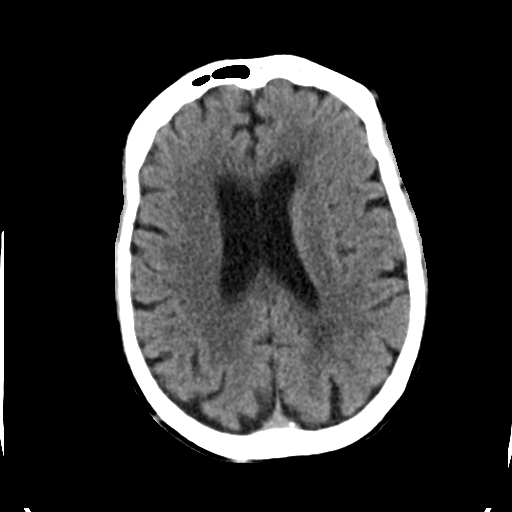
[im 23/32  brain]
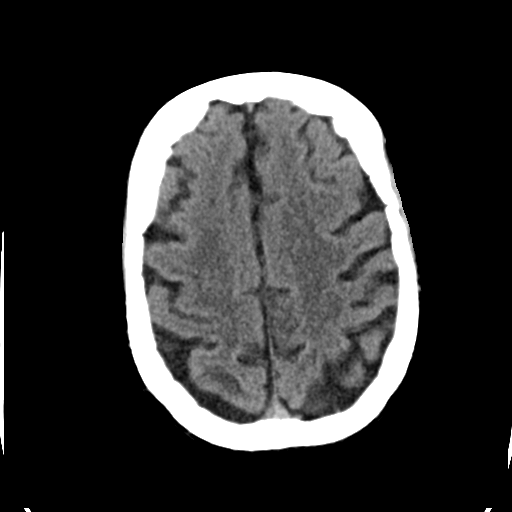
[im 26/32  brain]
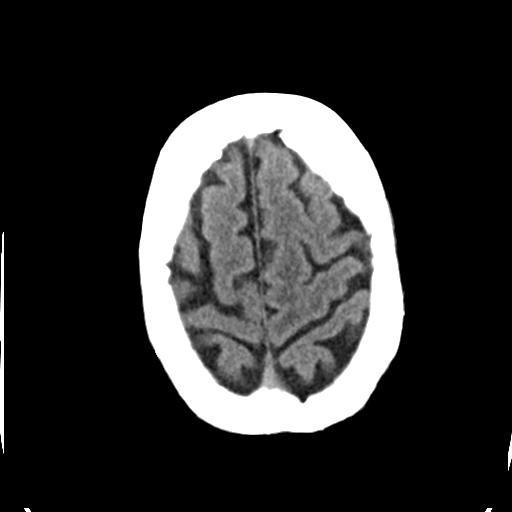
[im 29/32  brain]
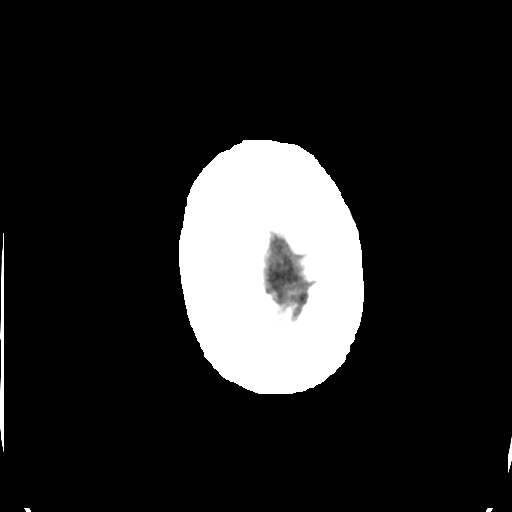
[im 29/32  bone]
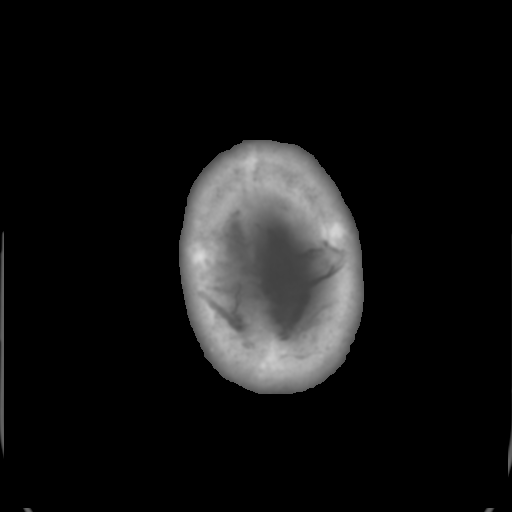

[Series 5: head 3.0 mpr cor · coronal · 0.31mm/px · 3 of 79 slices shown]
[im 27/79  brain]
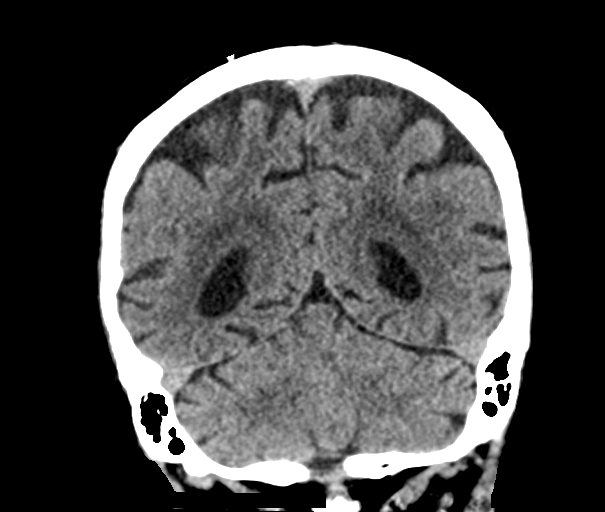
[im 35/79  brain]
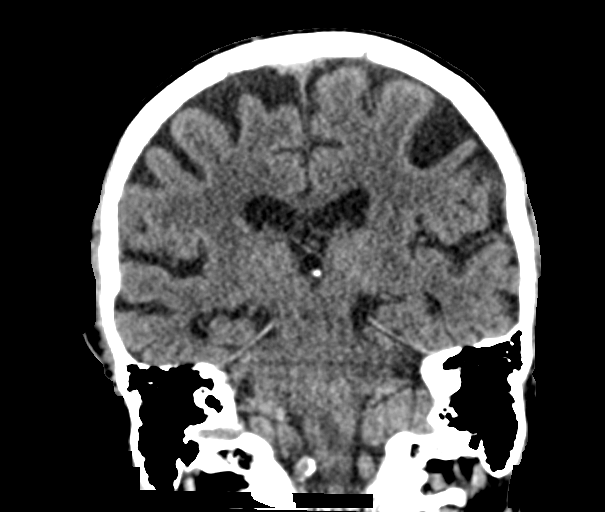
[im 44/79  brain]
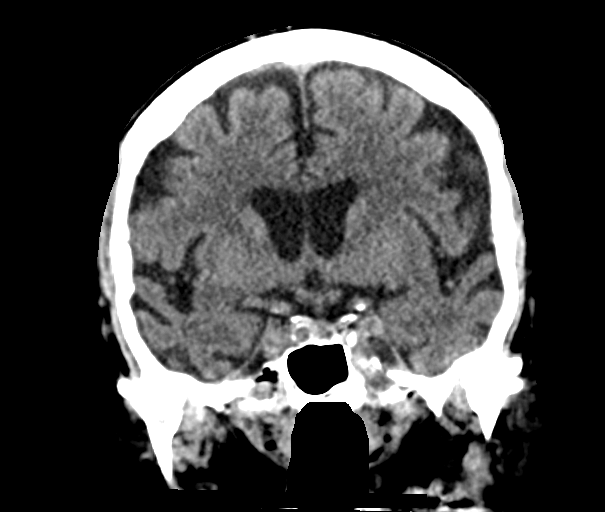

[Series 6: head 3.0 mpr sag · sagittal · 0.33mm/px · 3 of 54 slices shown]
[im 18/54  brain]
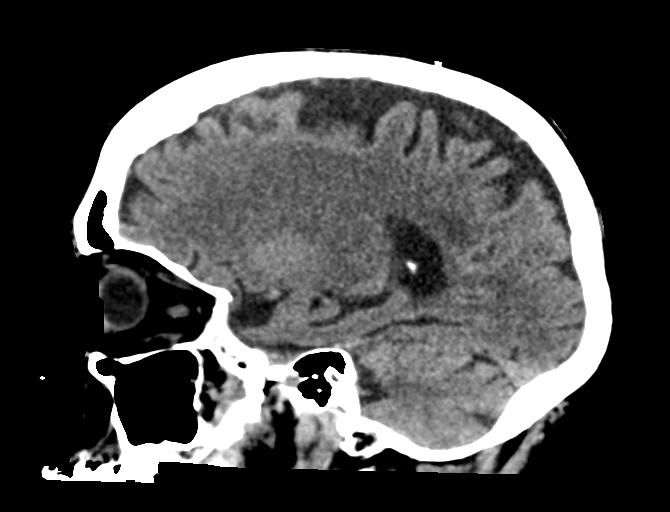
[im 27/54  brain]
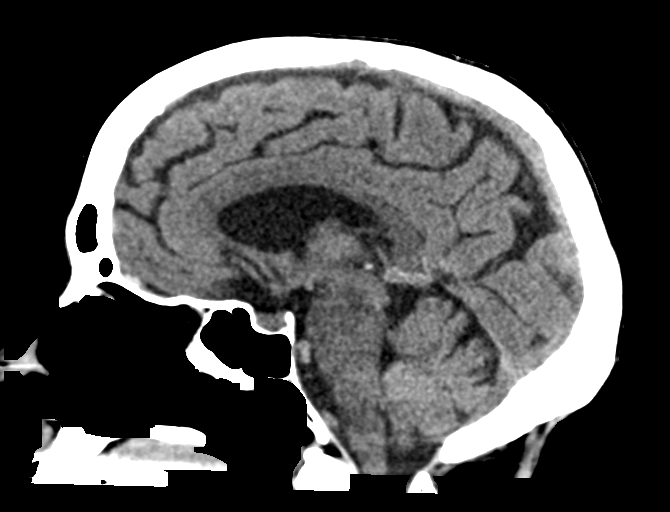
[im 36/54  brain]
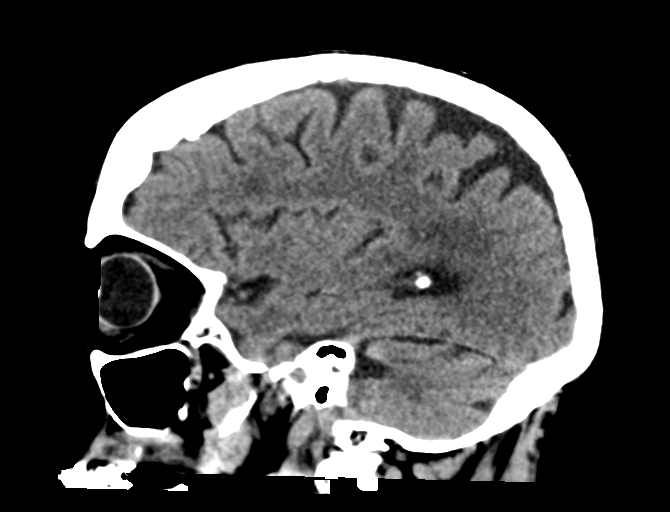

[15 of 47 positions shown; findings below may reference images not displayed]

FINDINGS: Brain: There is atrophy and chronic small vessel disease changes. No
acute intracranial abnormality. Specifically, no hemorrhage,
hydrocephalus, mass lesion, acute infarction, or significant
intracranial injury.

Vascular: No hyperdense vessel or unexpected calcification.

Skull: No acute calvarial abnormality.

Sinuses/Orbits: No acute findings

Other: None
IMPRESSION: Atrophy, chronic microvascular disease.

No acute intracranial abnormality.

## 2021-01-07 ENCOUNTER — Other Ambulatory Visit: Payer: Self-pay | Admitting: Cardiology

## 2021-01-07 NOTE — Telephone Encounter (Signed)
Refill sent to pharmacy.   

## 2021-01-08 DIAGNOSIS — I129 Hypertensive chronic kidney disease with stage 1 through stage 4 chronic kidney disease, or unspecified chronic kidney disease: Secondary | ICD-10-CM | POA: Diagnosis not present

## 2021-01-08 DIAGNOSIS — M25562 Pain in left knee: Secondary | ICD-10-CM | POA: Diagnosis not present

## 2021-01-08 DIAGNOSIS — I480 Paroxysmal atrial fibrillation: Secondary | ICD-10-CM | POA: Diagnosis not present

## 2021-01-08 DIAGNOSIS — Z7901 Long term (current) use of anticoagulants: Secondary | ICD-10-CM | POA: Diagnosis not present

## 2021-01-08 DIAGNOSIS — S32592D Other specified fracture of left pubis, subsequent encounter for fracture with routine healing: Secondary | ICD-10-CM | POA: Diagnosis not present

## 2021-01-08 DIAGNOSIS — G8929 Other chronic pain: Secondary | ICD-10-CM | POA: Diagnosis not present

## 2021-01-08 DIAGNOSIS — Z79891 Long term (current) use of opiate analgesic: Secondary | ICD-10-CM | POA: Diagnosis not present

## 2021-01-08 DIAGNOSIS — J9601 Acute respiratory failure with hypoxia: Secondary | ICD-10-CM | POA: Diagnosis not present

## 2021-01-08 DIAGNOSIS — D649 Anemia, unspecified: Secondary | ICD-10-CM | POA: Diagnosis not present

## 2021-01-08 DIAGNOSIS — M25561 Pain in right knee: Secondary | ICD-10-CM | POA: Diagnosis not present

## 2021-01-08 DIAGNOSIS — Z9181 History of falling: Secondary | ICD-10-CM | POA: Diagnosis not present

## 2021-01-08 DIAGNOSIS — S32415D Nondisplaced fracture of anterior wall of left acetabulum, subsequent encounter for fracture with routine healing: Secondary | ICD-10-CM | POA: Diagnosis not present

## 2021-01-08 DIAGNOSIS — N183 Chronic kidney disease, stage 3 unspecified: Secondary | ICD-10-CM | POA: Diagnosis not present

## 2021-01-08 DIAGNOSIS — U071 COVID-19: Secondary | ICD-10-CM | POA: Diagnosis not present

## 2021-01-08 IMAGING — DX DG CHEST 1V PORT
1 series · 1 of 1 positions shown · non-contrast
Comparison: December 15, 2020

CLINICAL DATA: Shortness of breath.  EABKM-YD positive

EXAM:
PORTABLE CHEST 1 VIEW

[chest]
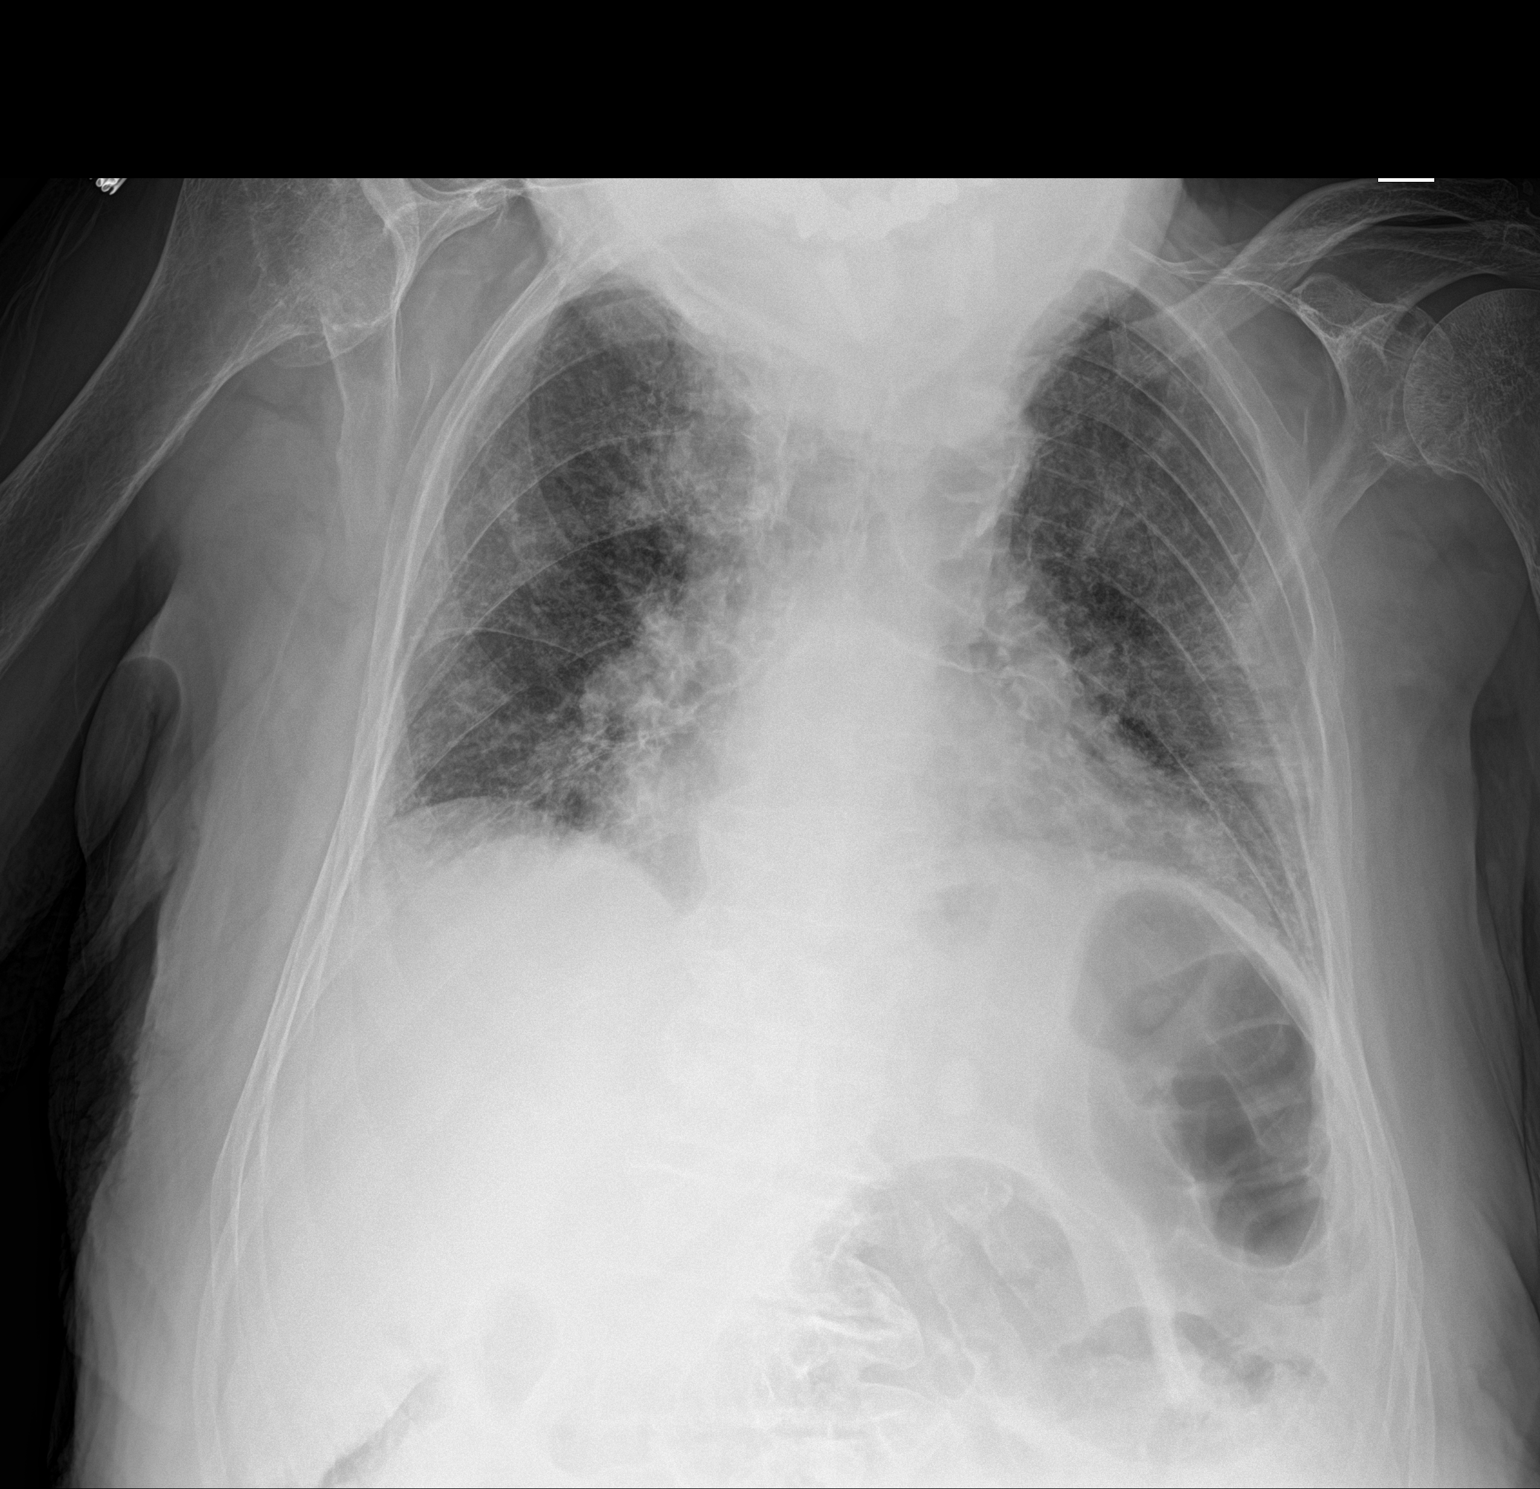

[1 of 1 positions shown; findings below may reference images not displayed]

FINDINGS: There has been partial clearing of ill-defined opacity from the
right mid lung. Patchy airspace opacity in the left mid lung and
left base regions persists. No new opacity evident. Heart mildly
enlarged with pulmonary vascularity normal. There is aortic
atherosclerosis. No adenopathy. Degenerative change noted in the
right shoulder.
IMPRESSION: Partial clearing of ill-defined airspace opacity from the right mid
lung region. Essentially stable of find areas of opacity left mid
lung and left base. Suspect multifocal pneumonia, likely of atypical
organism etiology. There may be a degree of underlying chronic
bronchitis. No new opacity evident. Stable cardiac prominence. No
adenopathy.

Aortic Atherosclerosis (QCHPA-944.4).

## 2021-01-15 DIAGNOSIS — J1282 Pneumonia due to coronavirus disease 2019: Secondary | ICD-10-CM | POA: Diagnosis not present

## 2021-01-15 DIAGNOSIS — I482 Chronic atrial fibrillation, unspecified: Secondary | ICD-10-CM | POA: Diagnosis not present

## 2021-01-15 DIAGNOSIS — D6869 Other thrombophilia: Secondary | ICD-10-CM | POA: Diagnosis not present

## 2021-01-15 DIAGNOSIS — Z79899 Other long term (current) drug therapy: Secondary | ICD-10-CM | POA: Diagnosis not present

## 2021-01-15 DIAGNOSIS — E538 Deficiency of other specified B group vitamins: Secondary | ICD-10-CM | POA: Diagnosis not present

## 2021-01-15 DIAGNOSIS — N390 Urinary tract infection, site not specified: Secondary | ICD-10-CM | POA: Diagnosis not present

## 2021-01-15 DIAGNOSIS — I10 Essential (primary) hypertension: Secondary | ICD-10-CM | POA: Diagnosis not present

## 2021-01-16 DIAGNOSIS — M25562 Pain in left knee: Secondary | ICD-10-CM | POA: Diagnosis not present

## 2021-01-16 DIAGNOSIS — S32415D Nondisplaced fracture of anterior wall of left acetabulum, subsequent encounter for fracture with routine healing: Secondary | ICD-10-CM | POA: Diagnosis not present

## 2021-01-16 DIAGNOSIS — G8929 Other chronic pain: Secondary | ICD-10-CM | POA: Diagnosis not present

## 2021-01-16 DIAGNOSIS — Z9181 History of falling: Secondary | ICD-10-CM | POA: Diagnosis not present

## 2021-01-16 DIAGNOSIS — Z7901 Long term (current) use of anticoagulants: Secondary | ICD-10-CM | POA: Diagnosis not present

## 2021-01-16 DIAGNOSIS — D649 Anemia, unspecified: Secondary | ICD-10-CM | POA: Diagnosis not present

## 2021-01-16 DIAGNOSIS — Z79891 Long term (current) use of opiate analgesic: Secondary | ICD-10-CM | POA: Diagnosis not present

## 2021-01-16 DIAGNOSIS — M25561 Pain in right knee: Secondary | ICD-10-CM | POA: Diagnosis not present

## 2021-01-16 DIAGNOSIS — I480 Paroxysmal atrial fibrillation: Secondary | ICD-10-CM | POA: Diagnosis not present

## 2021-01-16 DIAGNOSIS — J9601 Acute respiratory failure with hypoxia: Secondary | ICD-10-CM | POA: Diagnosis not present

## 2021-01-16 DIAGNOSIS — I129 Hypertensive chronic kidney disease with stage 1 through stage 4 chronic kidney disease, or unspecified chronic kidney disease: Secondary | ICD-10-CM | POA: Diagnosis not present

## 2021-01-16 DIAGNOSIS — S32592D Other specified fracture of left pubis, subsequent encounter for fracture with routine healing: Secondary | ICD-10-CM | POA: Diagnosis not present

## 2021-01-16 DIAGNOSIS — U071 COVID-19: Secondary | ICD-10-CM | POA: Diagnosis not present

## 2021-01-16 DIAGNOSIS — N183 Chronic kidney disease, stage 3 unspecified: Secondary | ICD-10-CM | POA: Diagnosis not present

## 2021-01-20 DIAGNOSIS — I129 Hypertensive chronic kidney disease with stage 1 through stage 4 chronic kidney disease, or unspecified chronic kidney disease: Secondary | ICD-10-CM | POA: Diagnosis not present

## 2021-01-20 DIAGNOSIS — J9601 Acute respiratory failure with hypoxia: Secondary | ICD-10-CM | POA: Diagnosis not present

## 2021-01-20 DIAGNOSIS — U071 COVID-19: Secondary | ICD-10-CM | POA: Diagnosis not present

## 2021-01-20 DIAGNOSIS — S32592D Other specified fracture of left pubis, subsequent encounter for fracture with routine healing: Secondary | ICD-10-CM | POA: Diagnosis not present

## 2021-01-20 DIAGNOSIS — N183 Chronic kidney disease, stage 3 unspecified: Secondary | ICD-10-CM | POA: Diagnosis not present

## 2021-01-20 DIAGNOSIS — S32415D Nondisplaced fracture of anterior wall of left acetabulum, subsequent encounter for fracture with routine healing: Secondary | ICD-10-CM | POA: Diagnosis not present

## 2021-01-20 DIAGNOSIS — I480 Paroxysmal atrial fibrillation: Secondary | ICD-10-CM | POA: Diagnosis not present

## 2021-01-23 DIAGNOSIS — J9601 Acute respiratory failure with hypoxia: Secondary | ICD-10-CM | POA: Diagnosis not present

## 2021-01-23 DIAGNOSIS — Z7901 Long term (current) use of anticoagulants: Secondary | ICD-10-CM | POA: Diagnosis not present

## 2021-01-23 DIAGNOSIS — Z9181 History of falling: Secondary | ICD-10-CM | POA: Diagnosis not present

## 2021-01-23 DIAGNOSIS — M25562 Pain in left knee: Secondary | ICD-10-CM | POA: Diagnosis not present

## 2021-01-23 DIAGNOSIS — U071 COVID-19: Secondary | ICD-10-CM | POA: Diagnosis not present

## 2021-01-23 DIAGNOSIS — S32415D Nondisplaced fracture of anterior wall of left acetabulum, subsequent encounter for fracture with routine healing: Secondary | ICD-10-CM | POA: Diagnosis not present

## 2021-01-23 DIAGNOSIS — M25561 Pain in right knee: Secondary | ICD-10-CM | POA: Diagnosis not present

## 2021-01-23 DIAGNOSIS — D649 Anemia, unspecified: Secondary | ICD-10-CM | POA: Diagnosis not present

## 2021-01-23 DIAGNOSIS — G8929 Other chronic pain: Secondary | ICD-10-CM | POA: Diagnosis not present

## 2021-01-23 DIAGNOSIS — Z79891 Long term (current) use of opiate analgesic: Secondary | ICD-10-CM | POA: Diagnosis not present

## 2021-01-23 DIAGNOSIS — N183 Chronic kidney disease, stage 3 unspecified: Secondary | ICD-10-CM | POA: Diagnosis not present

## 2021-01-23 DIAGNOSIS — I480 Paroxysmal atrial fibrillation: Secondary | ICD-10-CM | POA: Diagnosis not present

## 2021-01-23 DIAGNOSIS — I129 Hypertensive chronic kidney disease with stage 1 through stage 4 chronic kidney disease, or unspecified chronic kidney disease: Secondary | ICD-10-CM | POA: Diagnosis not present

## 2021-01-23 DIAGNOSIS — S32592D Other specified fracture of left pubis, subsequent encounter for fracture with routine healing: Secondary | ICD-10-CM | POA: Diagnosis not present

## 2021-01-30 DIAGNOSIS — S32592D Other specified fracture of left pubis, subsequent encounter for fracture with routine healing: Secondary | ICD-10-CM | POA: Diagnosis not present

## 2021-01-30 DIAGNOSIS — Z9181 History of falling: Secondary | ICD-10-CM | POA: Diagnosis not present

## 2021-01-30 DIAGNOSIS — D649 Anemia, unspecified: Secondary | ICD-10-CM | POA: Diagnosis not present

## 2021-01-30 DIAGNOSIS — U071 COVID-19: Secondary | ICD-10-CM | POA: Diagnosis not present

## 2021-01-30 DIAGNOSIS — I480 Paroxysmal atrial fibrillation: Secondary | ICD-10-CM | POA: Diagnosis not present

## 2021-01-30 DIAGNOSIS — Z79891 Long term (current) use of opiate analgesic: Secondary | ICD-10-CM | POA: Diagnosis not present

## 2021-01-30 DIAGNOSIS — G8929 Other chronic pain: Secondary | ICD-10-CM | POA: Diagnosis not present

## 2021-01-30 DIAGNOSIS — N183 Chronic kidney disease, stage 3 unspecified: Secondary | ICD-10-CM | POA: Diagnosis not present

## 2021-01-30 DIAGNOSIS — M25562 Pain in left knee: Secondary | ICD-10-CM | POA: Diagnosis not present

## 2021-01-30 DIAGNOSIS — I129 Hypertensive chronic kidney disease with stage 1 through stage 4 chronic kidney disease, or unspecified chronic kidney disease: Secondary | ICD-10-CM | POA: Diagnosis not present

## 2021-01-30 DIAGNOSIS — M25561 Pain in right knee: Secondary | ICD-10-CM | POA: Diagnosis not present

## 2021-01-30 DIAGNOSIS — Z7901 Long term (current) use of anticoagulants: Secondary | ICD-10-CM | POA: Diagnosis not present

## 2021-01-30 DIAGNOSIS — J9601 Acute respiratory failure with hypoxia: Secondary | ICD-10-CM | POA: Diagnosis not present

## 2021-01-30 DIAGNOSIS — S32415D Nondisplaced fracture of anterior wall of left acetabulum, subsequent encounter for fracture with routine healing: Secondary | ICD-10-CM | POA: Diagnosis not present

## 2021-02-06 DIAGNOSIS — R634 Abnormal weight loss: Secondary | ICD-10-CM | POA: Diagnosis not present

## 2021-02-06 DIAGNOSIS — R410 Disorientation, unspecified: Secondary | ICD-10-CM | POA: Diagnosis not present

## 2021-02-06 DIAGNOSIS — N3000 Acute cystitis without hematuria: Secondary | ICD-10-CM | POA: Diagnosis not present

## 2021-02-12 DIAGNOSIS — E538 Deficiency of other specified B group vitamins: Secondary | ICD-10-CM | POA: Diagnosis not present

## 2021-02-14 DIAGNOSIS — I129 Hypertensive chronic kidney disease with stage 1 through stage 4 chronic kidney disease, or unspecified chronic kidney disease: Secondary | ICD-10-CM | POA: Diagnosis not present

## 2021-02-14 DIAGNOSIS — J9601 Acute respiratory failure with hypoxia: Secondary | ICD-10-CM | POA: Diagnosis not present

## 2021-02-14 DIAGNOSIS — D649 Anemia, unspecified: Secondary | ICD-10-CM | POA: Diagnosis not present

## 2021-02-14 DIAGNOSIS — Z9181 History of falling: Secondary | ICD-10-CM | POA: Diagnosis not present

## 2021-02-14 DIAGNOSIS — Z79891 Long term (current) use of opiate analgesic: Secondary | ICD-10-CM | POA: Diagnosis not present

## 2021-02-14 DIAGNOSIS — N183 Chronic kidney disease, stage 3 unspecified: Secondary | ICD-10-CM | POA: Diagnosis not present

## 2021-02-14 DIAGNOSIS — S32592D Other specified fracture of left pubis, subsequent encounter for fracture with routine healing: Secondary | ICD-10-CM | POA: Diagnosis not present

## 2021-02-14 DIAGNOSIS — M25561 Pain in right knee: Secondary | ICD-10-CM | POA: Diagnosis not present

## 2021-02-14 DIAGNOSIS — I480 Paroxysmal atrial fibrillation: Secondary | ICD-10-CM | POA: Diagnosis not present

## 2021-02-14 DIAGNOSIS — U071 COVID-19: Secondary | ICD-10-CM | POA: Diagnosis not present

## 2021-02-14 DIAGNOSIS — Z7901 Long term (current) use of anticoagulants: Secondary | ICD-10-CM | POA: Diagnosis not present

## 2021-02-14 DIAGNOSIS — G8929 Other chronic pain: Secondary | ICD-10-CM | POA: Diagnosis not present

## 2021-02-14 DIAGNOSIS — S32415D Nondisplaced fracture of anterior wall of left acetabulum, subsequent encounter for fracture with routine healing: Secondary | ICD-10-CM | POA: Diagnosis not present

## 2021-02-14 DIAGNOSIS — M25562 Pain in left knee: Secondary | ICD-10-CM | POA: Diagnosis not present

## 2021-02-15 ENCOUNTER — Other Ambulatory Visit: Payer: Self-pay | Admitting: Cardiology

## 2021-02-19 DIAGNOSIS — D6869 Other thrombophilia: Secondary | ICD-10-CM | POA: Diagnosis not present

## 2021-02-19 DIAGNOSIS — I4891 Unspecified atrial fibrillation: Secondary | ICD-10-CM | POA: Diagnosis not present

## 2021-02-19 DIAGNOSIS — Z0001 Encounter for general adult medical examination with abnormal findings: Secondary | ICD-10-CM | POA: Diagnosis not present

## 2021-02-19 DIAGNOSIS — I10 Essential (primary) hypertension: Secondary | ICD-10-CM | POA: Diagnosis not present

## 2021-02-19 DIAGNOSIS — N390 Urinary tract infection, site not specified: Secondary | ICD-10-CM | POA: Diagnosis not present

## 2021-02-19 DIAGNOSIS — E875 Hyperkalemia: Secondary | ICD-10-CM | POA: Diagnosis not present

## 2021-03-05 DIAGNOSIS — G8929 Other chronic pain: Secondary | ICD-10-CM | POA: Diagnosis not present

## 2021-03-05 DIAGNOSIS — M25561 Pain in right knee: Secondary | ICD-10-CM | POA: Diagnosis not present

## 2021-03-05 DIAGNOSIS — M25562 Pain in left knee: Secondary | ICD-10-CM | POA: Diagnosis not present

## 2021-03-12 ENCOUNTER — Other Ambulatory Visit: Payer: Self-pay | Admitting: Cardiology

## 2021-03-14 DIAGNOSIS — I130 Hypertensive heart and chronic kidney disease with heart failure and stage 1 through stage 4 chronic kidney disease, or unspecified chronic kidney disease: Secondary | ICD-10-CM | POA: Diagnosis not present

## 2021-03-14 DIAGNOSIS — I5033 Acute on chronic diastolic (congestive) heart failure: Secondary | ICD-10-CM | POA: Diagnosis not present

## 2021-03-14 DIAGNOSIS — N183 Chronic kidney disease, stage 3 unspecified: Secondary | ICD-10-CM | POA: Diagnosis not present

## 2021-03-19 ENCOUNTER — Telehealth: Payer: Self-pay | Admitting: Cardiology

## 2021-03-19 NOTE — Telephone Encounter (Signed)
Called and spoke to [place]. Informed her we have him taking diltiazem 180 mg daily last sent in January 2022. She asked we fax most recent order of this. I did fax this. No further questions.

## 2021-03-19 NOTE — Telephone Encounter (Signed)
Pt c/o medication issue:  1. Name of Medication: diltiazem (CARDIZEM CD) 180 MG 24 hr capsule  2. How are you currently taking this medication (dosage and times per day)? Needs clarification  3. Are you having a reaction (difficulty breathing--STAT)? no  4. What is your medication issue? Themecia from [facility] states the patient's FL2 states 120 mg capsules of diltiazem, but the patient medication bottle says 180 mg capsules. She states they need clarification on whether the medication was increased and if so it needs to fax the new order to Fax: [phone], She would also like a call back

## 2021-03-21 DIAGNOSIS — Z7901 Long term (current) use of anticoagulants: Secondary | ICD-10-CM | POA: Diagnosis not present

## 2021-03-21 DIAGNOSIS — I110 Hypertensive heart disease with heart failure: Secondary | ICD-10-CM | POA: Diagnosis not present

## 2021-03-21 DIAGNOSIS — M1991 Primary osteoarthritis, unspecified site: Secondary | ICD-10-CM | POA: Diagnosis not present

## 2021-03-21 DIAGNOSIS — Z8781 Personal history of (healed) traumatic fracture: Secondary | ICD-10-CM | POA: Diagnosis not present

## 2021-03-21 DIAGNOSIS — I4821 Permanent atrial fibrillation: Secondary | ICD-10-CM | POA: Diagnosis not present

## 2021-03-21 DIAGNOSIS — G301 Alzheimer's disease with late onset: Secondary | ICD-10-CM | POA: Diagnosis not present

## 2021-03-21 DIAGNOSIS — N183 Chronic kidney disease, stage 3 unspecified: Secondary | ICD-10-CM | POA: Diagnosis not present

## 2021-03-21 DIAGNOSIS — Z8616 Personal history of COVID-19: Secondary | ICD-10-CM | POA: Diagnosis not present

## 2021-03-21 DIAGNOSIS — I10 Essential (primary) hypertension: Secondary | ICD-10-CM | POA: Diagnosis not present

## 2021-03-21 DIAGNOSIS — E538 Deficiency of other specified B group vitamins: Secondary | ICD-10-CM | POA: Diagnosis not present

## 2021-03-21 DIAGNOSIS — N390 Urinary tract infection, site not specified: Secondary | ICD-10-CM | POA: Diagnosis not present

## 2021-03-21 DIAGNOSIS — R296 Repeated falls: Secondary | ICD-10-CM | POA: Diagnosis not present

## 2021-03-26 ENCOUNTER — Other Ambulatory Visit: Payer: Self-pay | Admitting: Cardiology

## 2021-03-27 ENCOUNTER — Other Ambulatory Visit: Payer: Self-pay | Admitting: Cardiology

## 2021-03-27 NOTE — Telephone Encounter (Signed)
Diltiazem refills to pharmacy with message patient needs appointment for further refills

## 2021-04-04 DIAGNOSIS — R600 Localized edema: Secondary | ICD-10-CM | POA: Diagnosis not present

## 2021-04-04 DIAGNOSIS — B372 Candidiasis of skin and nail: Secondary | ICD-10-CM | POA: Diagnosis not present

## 2021-04-04 DIAGNOSIS — I130 Hypertensive heart and chronic kidney disease with heart failure and stage 1 through stage 4 chronic kidney disease, or unspecified chronic kidney disease: Secondary | ICD-10-CM | POA: Diagnosis not present

## 2021-04-06 DIAGNOSIS — W19XXXA Unspecified fall, initial encounter: Secondary | ICD-10-CM | POA: Diagnosis not present

## 2021-04-06 DIAGNOSIS — Z743 Need for continuous supervision: Secondary | ICD-10-CM | POA: Diagnosis not present

## 2021-04-06 DIAGNOSIS — I509 Heart failure, unspecified: Secondary | ICD-10-CM | POA: Diagnosis not present

## 2021-04-06 DIAGNOSIS — I110 Hypertensive heart disease with heart failure: Secondary | ICD-10-CM | POA: Diagnosis not present

## 2021-04-06 DIAGNOSIS — R6889 Other general symptoms and signs: Secondary | ICD-10-CM | POA: Diagnosis not present

## 2021-04-06 DIAGNOSIS — S0990XA Unspecified injury of head, initial encounter: Secondary | ICD-10-CM | POA: Diagnosis not present

## 2021-04-06 DIAGNOSIS — Z043 Encounter for examination and observation following other accident: Secondary | ICD-10-CM | POA: Diagnosis not present

## 2021-04-06 DIAGNOSIS — R519 Headache, unspecified: Secondary | ICD-10-CM | POA: Diagnosis not present

## 2021-04-06 DIAGNOSIS — I4891 Unspecified atrial fibrillation: Secondary | ICD-10-CM | POA: Diagnosis not present

## 2021-04-06 DIAGNOSIS — S0003XA Contusion of scalp, initial encounter: Secondary | ICD-10-CM | POA: Diagnosis not present

## 2021-04-09 ENCOUNTER — Other Ambulatory Visit: Payer: Self-pay | Admitting: Cardiology

## 2021-04-09 NOTE — Telephone Encounter (Signed)
Refill sent to pharmacy.   

## 2021-04-11 DIAGNOSIS — W19XXXA Unspecified fall, initial encounter: Secondary | ICD-10-CM | POA: Diagnosis not present

## 2021-04-11 DIAGNOSIS — S0010XD Contusion of unspecified eyelid and periocular area, subsequent encounter: Secondary | ICD-10-CM | POA: Diagnosis not present

## 2021-04-11 DIAGNOSIS — G301 Alzheimer's disease with late onset: Secondary | ICD-10-CM | POA: Diagnosis not present

## 2021-04-11 DIAGNOSIS — R296 Repeated falls: Secondary | ICD-10-CM | POA: Diagnosis not present

## 2021-04-18 DIAGNOSIS — N184 Chronic kidney disease, stage 4 (severe): Secondary | ICD-10-CM | POA: Diagnosis not present

## 2021-04-18 DIAGNOSIS — I5032 Chronic diastolic (congestive) heart failure: Secondary | ICD-10-CM | POA: Diagnosis not present

## 2021-04-18 DIAGNOSIS — M170 Bilateral primary osteoarthritis of knee: Secondary | ICD-10-CM | POA: Diagnosis not present

## 2021-04-23 DIAGNOSIS — E7849 Other hyperlipidemia: Secondary | ICD-10-CM | POA: Diagnosis not present

## 2021-04-23 DIAGNOSIS — Z79899 Other long term (current) drug therapy: Secondary | ICD-10-CM | POA: Diagnosis not present

## 2021-04-23 DIAGNOSIS — D518 Other vitamin B12 deficiency anemias: Secondary | ICD-10-CM | POA: Diagnosis not present

## 2021-04-23 DIAGNOSIS — E119 Type 2 diabetes mellitus without complications: Secondary | ICD-10-CM | POA: Diagnosis not present

## 2021-05-09 DIAGNOSIS — G309 Alzheimer's disease, unspecified: Secondary | ICD-10-CM | POA: Diagnosis not present

## 2021-05-09 DIAGNOSIS — R300 Dysuria: Secondary | ICD-10-CM | POA: Diagnosis not present

## 2021-05-16 DIAGNOSIS — R8279 Other abnormal findings on microbiological examination of urine: Secondary | ICD-10-CM | POA: Diagnosis not present

## 2021-06-06 DIAGNOSIS — M25561 Pain in right knee: Secondary | ICD-10-CM | POA: Diagnosis not present

## 2021-06-06 DIAGNOSIS — M25562 Pain in left knee: Secondary | ICD-10-CM | POA: Diagnosis not present

## 2021-06-20 DIAGNOSIS — H02842 Edema of right lower eyelid: Secondary | ICD-10-CM | POA: Diagnosis not present

## 2021-06-20 DIAGNOSIS — G309 Alzheimer's disease, unspecified: Secondary | ICD-10-CM | POA: Diagnosis not present

## 2021-07-09 DIAGNOSIS — D518 Other vitamin B12 deficiency anemias: Secondary | ICD-10-CM | POA: Diagnosis not present

## 2021-07-09 DIAGNOSIS — E119 Type 2 diabetes mellitus without complications: Secondary | ICD-10-CM | POA: Diagnosis not present

## 2021-07-11 DIAGNOSIS — E538 Deficiency of other specified B group vitamins: Secondary | ICD-10-CM | POA: Diagnosis not present

## 2021-07-11 DIAGNOSIS — M159 Polyosteoarthritis, unspecified: Secondary | ICD-10-CM | POA: Diagnosis not present

## 2021-07-11 DIAGNOSIS — K219 Gastro-esophageal reflux disease without esophagitis: Secondary | ICD-10-CM | POA: Diagnosis not present

## 2021-07-11 DIAGNOSIS — N1832 Chronic kidney disease, stage 3b: Secondary | ICD-10-CM | POA: Diagnosis not present

## 2021-07-11 DIAGNOSIS — E876 Hypokalemia: Secondary | ICD-10-CM | POA: Diagnosis not present

## 2021-07-11 DIAGNOSIS — I129 Hypertensive chronic kidney disease with stage 1 through stage 4 chronic kidney disease, or unspecified chronic kidney disease: Secondary | ICD-10-CM | POA: Diagnosis not present

## 2021-07-11 DIAGNOSIS — G309 Alzheimer's disease, unspecified: Secondary | ICD-10-CM | POA: Diagnosis not present

## 2021-07-11 DIAGNOSIS — I4811 Longstanding persistent atrial fibrillation: Secondary | ICD-10-CM | POA: Diagnosis not present

## 2021-07-11 DIAGNOSIS — I509 Heart failure, unspecified: Secondary | ICD-10-CM | POA: Diagnosis not present

## 2021-07-11 DIAGNOSIS — Z7901 Long term (current) use of anticoagulants: Secondary | ICD-10-CM | POA: Diagnosis not present

## 2021-07-11 DIAGNOSIS — N390 Urinary tract infection, site not specified: Secondary | ICD-10-CM | POA: Diagnosis not present

## 2021-07-17 DIAGNOSIS — E119 Type 2 diabetes mellitus without complications: Secondary | ICD-10-CM | POA: Diagnosis not present

## 2021-07-17 DIAGNOSIS — E038 Other specified hypothyroidism: Secondary | ICD-10-CM | POA: Diagnosis not present

## 2021-07-17 DIAGNOSIS — D518 Other vitamin B12 deficiency anemias: Secondary | ICD-10-CM | POA: Diagnosis not present

## 2021-07-17 DIAGNOSIS — E7849 Other hyperlipidemia: Secondary | ICD-10-CM | POA: Diagnosis not present

## 2021-07-17 DIAGNOSIS — Z79899 Other long term (current) drug therapy: Secondary | ICD-10-CM | POA: Diagnosis not present

## 2021-07-18 DIAGNOSIS — E559 Vitamin D deficiency, unspecified: Secondary | ICD-10-CM | POA: Diagnosis not present

## 2021-07-18 DIAGNOSIS — D638 Anemia in other chronic diseases classified elsewhere: Secondary | ICD-10-CM | POA: Diagnosis not present

## 2021-07-18 DIAGNOSIS — N1832 Chronic kidney disease, stage 3b: Secondary | ICD-10-CM | POA: Diagnosis not present

## 2021-08-08 DIAGNOSIS — D2239 Melanocytic nevi of other parts of face: Secondary | ICD-10-CM | POA: Diagnosis not present

## 2021-08-08 DIAGNOSIS — L820 Inflamed seborrheic keratosis: Secondary | ICD-10-CM | POA: Diagnosis not present

## 2021-08-12 DIAGNOSIS — R2689 Other abnormalities of gait and mobility: Secondary | ICD-10-CM | POA: Diagnosis not present

## 2021-08-12 DIAGNOSIS — W19XXXA Unspecified fall, initial encounter: Secondary | ICD-10-CM | POA: Diagnosis not present

## 2021-08-12 DIAGNOSIS — M25511 Pain in right shoulder: Secondary | ICD-10-CM | POA: Diagnosis not present

## 2021-08-12 DIAGNOSIS — R5381 Other malaise: Secondary | ICD-10-CM | POA: Diagnosis not present

## 2021-08-12 DIAGNOSIS — Z96652 Presence of left artificial knee joint: Secondary | ICD-10-CM | POA: Diagnosis not present

## 2021-08-12 DIAGNOSIS — R0902 Hypoxemia: Secondary | ICD-10-CM | POA: Diagnosis not present

## 2021-08-12 DIAGNOSIS — Z743 Need for continuous supervision: Secondary | ICD-10-CM | POA: Diagnosis not present

## 2021-08-12 DIAGNOSIS — M25562 Pain in left knee: Secondary | ICD-10-CM | POA: Diagnosis not present

## 2021-08-15 DIAGNOSIS — M1991 Primary osteoarthritis, unspecified site: Secondary | ICD-10-CM | POA: Diagnosis not present

## 2021-08-15 DIAGNOSIS — M25562 Pain in left knee: Secondary | ICD-10-CM | POA: Diagnosis not present

## 2021-08-15 DIAGNOSIS — W19XXXD Unspecified fall, subsequent encounter: Secondary | ICD-10-CM | POA: Diagnosis not present

## 2021-08-15 DIAGNOSIS — M25511 Pain in right shoulder: Secondary | ICD-10-CM | POA: Diagnosis not present

## 2021-08-20 DIAGNOSIS — N189 Chronic kidney disease, unspecified: Secondary | ICD-10-CM | POA: Diagnosis not present

## 2021-08-20 DIAGNOSIS — I129 Hypertensive chronic kidney disease with stage 1 through stage 4 chronic kidney disease, or unspecified chronic kidney disease: Secondary | ICD-10-CM | POA: Diagnosis not present

## 2021-08-20 DIAGNOSIS — I4891 Unspecified atrial fibrillation: Secondary | ICD-10-CM | POA: Diagnosis not present

## 2021-08-20 DIAGNOSIS — M170 Bilateral primary osteoarthritis of knee: Secondary | ICD-10-CM | POA: Diagnosis not present

## 2021-08-22 DIAGNOSIS — M170 Bilateral primary osteoarthritis of knee: Secondary | ICD-10-CM | POA: Diagnosis not present

## 2021-08-22 DIAGNOSIS — I4891 Unspecified atrial fibrillation: Secondary | ICD-10-CM | POA: Diagnosis not present

## 2021-08-22 DIAGNOSIS — I129 Hypertensive chronic kidney disease with stage 1 through stage 4 chronic kidney disease, or unspecified chronic kidney disease: Secondary | ICD-10-CM | POA: Diagnosis not present

## 2021-08-22 DIAGNOSIS — N189 Chronic kidney disease, unspecified: Secondary | ICD-10-CM | POA: Diagnosis not present

## 2021-08-26 DIAGNOSIS — I129 Hypertensive chronic kidney disease with stage 1 through stage 4 chronic kidney disease, or unspecified chronic kidney disease: Secondary | ICD-10-CM | POA: Diagnosis not present

## 2021-08-26 DIAGNOSIS — N189 Chronic kidney disease, unspecified: Secondary | ICD-10-CM | POA: Diagnosis not present

## 2021-08-26 DIAGNOSIS — M170 Bilateral primary osteoarthritis of knee: Secondary | ICD-10-CM | POA: Diagnosis not present

## 2021-08-26 DIAGNOSIS — I4891 Unspecified atrial fibrillation: Secondary | ICD-10-CM | POA: Diagnosis not present

## 2021-08-29 DIAGNOSIS — I129 Hypertensive chronic kidney disease with stage 1 through stage 4 chronic kidney disease, or unspecified chronic kidney disease: Secondary | ICD-10-CM | POA: Diagnosis not present

## 2021-08-29 DIAGNOSIS — M170 Bilateral primary osteoarthritis of knee: Secondary | ICD-10-CM | POA: Diagnosis not present

## 2021-08-29 DIAGNOSIS — N189 Chronic kidney disease, unspecified: Secondary | ICD-10-CM | POA: Diagnosis not present

## 2021-08-29 DIAGNOSIS — I4891 Unspecified atrial fibrillation: Secondary | ICD-10-CM | POA: Diagnosis not present

## 2021-09-01 DIAGNOSIS — M542 Cervicalgia: Secondary | ICD-10-CM | POA: Diagnosis not present

## 2021-09-01 DIAGNOSIS — R58 Hemorrhage, not elsewhere classified: Secondary | ICD-10-CM | POA: Diagnosis not present

## 2021-09-01 DIAGNOSIS — I509 Heart failure, unspecified: Secondary | ICD-10-CM | POA: Diagnosis not present

## 2021-09-01 DIAGNOSIS — I739 Peripheral vascular disease, unspecified: Secondary | ICD-10-CM | POA: Diagnosis not present

## 2021-09-01 DIAGNOSIS — W19XXXA Unspecified fall, initial encounter: Secondary | ICD-10-CM | POA: Diagnosis not present

## 2021-09-01 DIAGNOSIS — Z79899 Other long term (current) drug therapy: Secondary | ICD-10-CM | POA: Diagnosis not present

## 2021-09-01 DIAGNOSIS — I4891 Unspecified atrial fibrillation: Secondary | ICD-10-CM | POA: Diagnosis not present

## 2021-09-01 DIAGNOSIS — D62 Acute posthemorrhagic anemia: Secondary | ICD-10-CM | POA: Diagnosis not present

## 2021-09-01 DIAGNOSIS — R0902 Hypoxemia: Secondary | ICD-10-CM | POA: Diagnosis not present

## 2021-09-01 DIAGNOSIS — I252 Old myocardial infarction: Secondary | ICD-10-CM | POA: Diagnosis not present

## 2021-09-01 DIAGNOSIS — S0003XA Contusion of scalp, initial encounter: Secondary | ICD-10-CM | POA: Diagnosis not present

## 2021-09-01 DIAGNOSIS — R6889 Other general symptoms and signs: Secondary | ICD-10-CM | POA: Diagnosis not present

## 2021-09-01 DIAGNOSIS — S0181XA Laceration without foreign body of other part of head, initial encounter: Secondary | ICD-10-CM | POA: Diagnosis not present

## 2021-09-01 DIAGNOSIS — R410 Disorientation, unspecified: Secondary | ICD-10-CM | POA: Diagnosis not present

## 2021-09-01 DIAGNOSIS — S01111A Laceration without foreign body of right eyelid and periocular area, initial encounter: Secondary | ICD-10-CM | POA: Diagnosis not present

## 2021-09-01 DIAGNOSIS — S199XXA Unspecified injury of neck, initial encounter: Secondary | ICD-10-CM | POA: Diagnosis not present

## 2021-09-01 DIAGNOSIS — G319 Degenerative disease of nervous system, unspecified: Secondary | ICD-10-CM | POA: Diagnosis not present

## 2021-09-01 DIAGNOSIS — Z7901 Long term (current) use of anticoagulants: Secondary | ICD-10-CM | POA: Diagnosis not present

## 2021-09-01 DIAGNOSIS — Z743 Need for continuous supervision: Secondary | ICD-10-CM | POA: Diagnosis not present

## 2021-09-01 DIAGNOSIS — I110 Hypertensive heart disease with heart failure: Secondary | ICD-10-CM | POA: Diagnosis not present

## 2021-09-01 DIAGNOSIS — D500 Iron deficiency anemia secondary to blood loss (chronic): Secondary | ICD-10-CM | POA: Diagnosis not present

## 2021-09-01 DIAGNOSIS — S01112A Laceration without foreign body of left eyelid and periocular area, initial encounter: Secondary | ICD-10-CM | POA: Diagnosis not present

## 2021-09-02 DIAGNOSIS — I4891 Unspecified atrial fibrillation: Secondary | ICD-10-CM | POA: Diagnosis not present

## 2021-09-02 DIAGNOSIS — N189 Chronic kidney disease, unspecified: Secondary | ICD-10-CM | POA: Diagnosis not present

## 2021-09-02 DIAGNOSIS — I129 Hypertensive chronic kidney disease with stage 1 through stage 4 chronic kidney disease, or unspecified chronic kidney disease: Secondary | ICD-10-CM | POA: Diagnosis not present

## 2021-09-02 DIAGNOSIS — M170 Bilateral primary osteoarthritis of knee: Secondary | ICD-10-CM | POA: Diagnosis not present

## 2021-09-04 DIAGNOSIS — N189 Chronic kidney disease, unspecified: Secondary | ICD-10-CM | POA: Diagnosis not present

## 2021-09-04 DIAGNOSIS — I129 Hypertensive chronic kidney disease with stage 1 through stage 4 chronic kidney disease, or unspecified chronic kidney disease: Secondary | ICD-10-CM | POA: Diagnosis not present

## 2021-09-04 DIAGNOSIS — M170 Bilateral primary osteoarthritis of knee: Secondary | ICD-10-CM | POA: Diagnosis not present

## 2021-09-04 DIAGNOSIS — I4891 Unspecified atrial fibrillation: Secondary | ICD-10-CM | POA: Diagnosis not present

## 2021-09-05 DIAGNOSIS — W19XXXD Unspecified fall, subsequent encounter: Secondary | ICD-10-CM | POA: Diagnosis not present

## 2021-09-05 DIAGNOSIS — R2681 Unsteadiness on feet: Secondary | ICD-10-CM | POA: Diagnosis not present

## 2021-09-05 DIAGNOSIS — G301 Alzheimer's disease with late onset: Secondary | ICD-10-CM | POA: Diagnosis not present

## 2021-09-05 DIAGNOSIS — S0181XD Laceration without foreign body of other part of head, subsequent encounter: Secondary | ICD-10-CM | POA: Diagnosis not present

## 2021-09-05 DIAGNOSIS — R296 Repeated falls: Secondary | ICD-10-CM | POA: Diagnosis not present

## 2021-09-10 DIAGNOSIS — I4891 Unspecified atrial fibrillation: Secondary | ICD-10-CM | POA: Diagnosis not present

## 2021-09-10 DIAGNOSIS — I129 Hypertensive chronic kidney disease with stage 1 through stage 4 chronic kidney disease, or unspecified chronic kidney disease: Secondary | ICD-10-CM | POA: Diagnosis not present

## 2021-09-10 DIAGNOSIS — N189 Chronic kidney disease, unspecified: Secondary | ICD-10-CM | POA: Diagnosis not present

## 2021-09-10 DIAGNOSIS — M170 Bilateral primary osteoarthritis of knee: Secondary | ICD-10-CM | POA: Diagnosis not present

## 2021-09-12 DIAGNOSIS — R296 Repeated falls: Secondary | ICD-10-CM | POA: Diagnosis not present

## 2021-09-12 DIAGNOSIS — G894 Chronic pain syndrome: Secondary | ICD-10-CM | POA: Diagnosis not present

## 2021-09-12 DIAGNOSIS — R52 Pain, unspecified: Secondary | ICD-10-CM | POA: Diagnosis not present

## 2021-09-12 DIAGNOSIS — S0181XD Laceration without foreign body of other part of head, subsequent encounter: Secondary | ICD-10-CM | POA: Diagnosis not present

## 2021-09-12 DIAGNOSIS — M159 Polyosteoarthritis, unspecified: Secondary | ICD-10-CM | POA: Diagnosis not present

## 2021-09-15 DIAGNOSIS — Z743 Need for continuous supervision: Secondary | ICD-10-CM | POA: Diagnosis not present

## 2021-09-15 DIAGNOSIS — S0181XA Laceration without foreign body of other part of head, initial encounter: Secondary | ICD-10-CM | POA: Diagnosis not present

## 2021-09-15 DIAGNOSIS — W19XXXA Unspecified fall, initial encounter: Secondary | ICD-10-CM | POA: Diagnosis not present

## 2021-09-15 DIAGNOSIS — R404 Transient alteration of awareness: Secondary | ICD-10-CM | POA: Diagnosis not present

## 2021-09-15 DIAGNOSIS — S0003XA Contusion of scalp, initial encounter: Secondary | ICD-10-CM | POA: Diagnosis not present

## 2021-09-15 DIAGNOSIS — R531 Weakness: Secondary | ICD-10-CM | POA: Diagnosis not present

## 2021-09-17 DIAGNOSIS — M170 Bilateral primary osteoarthritis of knee: Secondary | ICD-10-CM | POA: Diagnosis not present

## 2021-09-17 DIAGNOSIS — I4891 Unspecified atrial fibrillation: Secondary | ICD-10-CM | POA: Diagnosis not present

## 2021-09-17 DIAGNOSIS — I129 Hypertensive chronic kidney disease with stage 1 through stage 4 chronic kidney disease, or unspecified chronic kidney disease: Secondary | ICD-10-CM | POA: Diagnosis not present

## 2021-09-17 DIAGNOSIS — N189 Chronic kidney disease, unspecified: Secondary | ICD-10-CM | POA: Diagnosis not present

## 2021-09-19 DIAGNOSIS — M419 Scoliosis, unspecified: Secondary | ICD-10-CM | POA: Diagnosis not present

## 2021-09-19 DIAGNOSIS — S12101A Unspecified nondisplaced fracture of second cervical vertebra, initial encounter for closed fracture: Secondary | ICD-10-CM | POA: Diagnosis not present

## 2021-09-19 DIAGNOSIS — R319 Hematuria, unspecified: Secondary | ICD-10-CM | POA: Diagnosis not present

## 2021-09-19 DIAGNOSIS — K5730 Diverticulosis of large intestine without perforation or abscess without bleeding: Secondary | ICD-10-CM | POA: Diagnosis not present

## 2021-09-19 DIAGNOSIS — K7689 Other specified diseases of liver: Secondary | ICD-10-CM | POA: Diagnosis not present

## 2021-09-19 DIAGNOSIS — S299XXA Unspecified injury of thorax, initial encounter: Secondary | ICD-10-CM | POA: Diagnosis not present

## 2021-09-19 DIAGNOSIS — S129XXA Fracture of neck, unspecified, initial encounter: Secondary | ICD-10-CM | POA: Diagnosis not present

## 2021-09-19 DIAGNOSIS — I4891 Unspecified atrial fibrillation: Secondary | ICD-10-CM | POA: Diagnosis not present

## 2021-09-19 DIAGNOSIS — N390 Urinary tract infection, site not specified: Secondary | ICD-10-CM | POA: Diagnosis not present

## 2021-09-19 DIAGNOSIS — S81811A Laceration without foreign body, right lower leg, initial encounter: Secondary | ICD-10-CM | POA: Diagnosis not present

## 2021-09-19 DIAGNOSIS — S0003XA Contusion of scalp, initial encounter: Secondary | ICD-10-CM | POA: Diagnosis not present

## 2021-09-19 DIAGNOSIS — I509 Heart failure, unspecified: Secondary | ICD-10-CM | POA: Diagnosis not present

## 2021-09-19 DIAGNOSIS — S12112A Nondisplaced Type II dens fracture, initial encounter for closed fracture: Secondary | ICD-10-CM | POA: Diagnosis not present

## 2021-09-19 DIAGNOSIS — S02102A Fracture of base of skull, left side, initial encounter for closed fracture: Secondary | ICD-10-CM | POA: Diagnosis not present

## 2021-09-19 DIAGNOSIS — S2243XA Multiple fractures of ribs, bilateral, initial encounter for closed fracture: Secondary | ICD-10-CM | POA: Diagnosis not present

## 2021-09-19 DIAGNOSIS — N200 Calculus of kidney: Secondary | ICD-10-CM | POA: Diagnosis not present

## 2021-09-19 DIAGNOSIS — S0083XA Contusion of other part of head, initial encounter: Secondary | ICD-10-CM | POA: Diagnosis not present

## 2021-09-19 DIAGNOSIS — S0990XA Unspecified injury of head, initial encounter: Secondary | ICD-10-CM | POA: Diagnosis not present

## 2021-09-19 DIAGNOSIS — I110 Hypertensive heart disease with heart failure: Secondary | ICD-10-CM | POA: Diagnosis not present

## 2021-09-19 DIAGNOSIS — S51012A Laceration without foreign body of left elbow, initial encounter: Secondary | ICD-10-CM | POA: Diagnosis not present

## 2021-09-19 DIAGNOSIS — I2510 Atherosclerotic heart disease of native coronary artery without angina pectoris: Secondary | ICD-10-CM | POA: Diagnosis not present

## 2021-09-19 DIAGNOSIS — N323 Diverticulum of bladder: Secondary | ICD-10-CM | POA: Diagnosis not present

## 2021-09-19 DIAGNOSIS — Z043 Encounter for examination and observation following other accident: Secondary | ICD-10-CM | POA: Diagnosis not present

## 2021-09-19 DIAGNOSIS — I6529 Occlusion and stenosis of unspecified carotid artery: Secondary | ICD-10-CM | POA: Diagnosis not present

## 2021-09-19 DIAGNOSIS — J9811 Atelectasis: Secondary | ICD-10-CM | POA: Diagnosis not present

## 2021-09-19 DIAGNOSIS — Z743 Need for continuous supervision: Secondary | ICD-10-CM | POA: Diagnosis not present

## 2021-09-19 DIAGNOSIS — W19XXXA Unspecified fall, initial encounter: Secondary | ICD-10-CM | POA: Diagnosis not present

## 2021-09-19 DIAGNOSIS — M25561 Pain in right knee: Secondary | ICD-10-CM | POA: Diagnosis not present

## 2021-09-19 DIAGNOSIS — D1800 Hemangioma unspecified site: Secondary | ICD-10-CM | POA: Diagnosis not present

## 2021-09-20 DIAGNOSIS — M170 Bilateral primary osteoarthritis of knee: Secondary | ICD-10-CM | POA: Diagnosis not present

## 2021-09-20 DIAGNOSIS — N189 Chronic kidney disease, unspecified: Secondary | ICD-10-CM | POA: Diagnosis not present

## 2021-09-20 DIAGNOSIS — I129 Hypertensive chronic kidney disease with stage 1 through stage 4 chronic kidney disease, or unspecified chronic kidney disease: Secondary | ICD-10-CM | POA: Diagnosis not present

## 2021-09-20 DIAGNOSIS — I4891 Unspecified atrial fibrillation: Secondary | ICD-10-CM | POA: Diagnosis not present

## 2021-09-21 DIAGNOSIS — R0902 Hypoxemia: Secondary | ICD-10-CM | POA: Diagnosis not present

## 2021-09-21 DIAGNOSIS — R6889 Other general symptoms and signs: Secondary | ICD-10-CM | POA: Diagnosis not present

## 2021-09-21 DIAGNOSIS — Z4801 Encounter for change or removal of surgical wound dressing: Secondary | ICD-10-CM | POA: Diagnosis not present

## 2021-09-21 DIAGNOSIS — Z743 Need for continuous supervision: Secondary | ICD-10-CM | POA: Diagnosis not present

## 2021-09-21 DIAGNOSIS — Z4800 Encounter for change or removal of nonsurgical wound dressing: Secondary | ICD-10-CM | POA: Diagnosis not present

## 2021-09-21 DIAGNOSIS — W19XXXA Unspecified fall, initial encounter: Secondary | ICD-10-CM | POA: Diagnosis not present

## 2021-09-21 DIAGNOSIS — G4489 Other headache syndrome: Secondary | ICD-10-CM | POA: Diagnosis not present

## 2021-09-23 DIAGNOSIS — I4891 Unspecified atrial fibrillation: Secondary | ICD-10-CM | POA: Diagnosis not present

## 2021-09-23 DIAGNOSIS — N189 Chronic kidney disease, unspecified: Secondary | ICD-10-CM | POA: Diagnosis not present

## 2021-09-23 DIAGNOSIS — I129 Hypertensive chronic kidney disease with stage 1 through stage 4 chronic kidney disease, or unspecified chronic kidney disease: Secondary | ICD-10-CM | POA: Diagnosis not present

## 2021-09-23 DIAGNOSIS — M170 Bilateral primary osteoarthritis of knee: Secondary | ICD-10-CM | POA: Diagnosis not present

## 2021-09-24 DIAGNOSIS — S129XXA Fracture of neck, unspecified, initial encounter: Secondary | ICD-10-CM | POA: Diagnosis not present

## 2021-09-24 DIAGNOSIS — I517 Cardiomegaly: Secondary | ICD-10-CM | POA: Diagnosis not present

## 2021-09-24 DIAGNOSIS — Z743 Need for continuous supervision: Secondary | ICD-10-CM | POA: Diagnosis not present

## 2021-09-24 DIAGNOSIS — W19XXXA Unspecified fall, initial encounter: Secondary | ICD-10-CM | POA: Diagnosis not present

## 2021-09-24 DIAGNOSIS — R296 Repeated falls: Secondary | ICD-10-CM | POA: Diagnosis not present

## 2021-09-24 DIAGNOSIS — I672 Cerebral atherosclerosis: Secondary | ICD-10-CM | POA: Diagnosis not present

## 2021-09-24 DIAGNOSIS — S0003XA Contusion of scalp, initial encounter: Secondary | ICD-10-CM | POA: Diagnosis not present

## 2021-09-24 DIAGNOSIS — S0990XA Unspecified injury of head, initial encounter: Secondary | ICD-10-CM | POA: Diagnosis not present

## 2021-09-24 DIAGNOSIS — R0902 Hypoxemia: Secondary | ICD-10-CM | POA: Diagnosis not present

## 2021-09-24 DIAGNOSIS — S0083XA Contusion of other part of head, initial encounter: Secondary | ICD-10-CM | POA: Diagnosis not present

## 2021-09-24 DIAGNOSIS — S12101A Unspecified nondisplaced fracture of second cervical vertebra, initial encounter for closed fracture: Secondary | ICD-10-CM | POA: Diagnosis not present

## 2021-09-24 DIAGNOSIS — Z7901 Long term (current) use of anticoagulants: Secondary | ICD-10-CM | POA: Diagnosis not present

## 2021-09-24 DIAGNOSIS — R58 Hemorrhage, not elsewhere classified: Secondary | ICD-10-CM | POA: Diagnosis not present

## 2021-09-24 DIAGNOSIS — S13150A Subluxation of C4/C5 cervical vertebrae, initial encounter: Secondary | ICD-10-CM | POA: Diagnosis not present

## 2021-09-26 DIAGNOSIS — I4891 Unspecified atrial fibrillation: Secondary | ICD-10-CM | POA: Diagnosis not present

## 2021-09-26 DIAGNOSIS — T1490XA Injury, unspecified, initial encounter: Secondary | ICD-10-CM | POA: Diagnosis not present

## 2021-09-26 DIAGNOSIS — I129 Hypertensive chronic kidney disease with stage 1 through stage 4 chronic kidney disease, or unspecified chronic kidney disease: Secondary | ICD-10-CM | POA: Diagnosis not present

## 2021-09-26 DIAGNOSIS — W19XXXA Unspecified fall, initial encounter: Secondary | ICD-10-CM | POA: Diagnosis not present

## 2021-09-26 DIAGNOSIS — Z043 Encounter for examination and observation following other accident: Secondary | ICD-10-CM | POA: Diagnosis not present

## 2021-09-26 DIAGNOSIS — R52 Pain, unspecified: Secondary | ICD-10-CM | POA: Diagnosis not present

## 2021-09-26 DIAGNOSIS — Z7901 Long term (current) use of anticoagulants: Secondary | ICD-10-CM | POA: Diagnosis not present

## 2021-09-26 DIAGNOSIS — Z743 Need for continuous supervision: Secondary | ICD-10-CM | POA: Diagnosis not present

## 2021-09-26 DIAGNOSIS — R0902 Hypoxemia: Secondary | ICD-10-CM | POA: Diagnosis not present

## 2021-09-26 DIAGNOSIS — R519 Headache, unspecified: Secondary | ICD-10-CM | POA: Diagnosis not present

## 2021-09-26 DIAGNOSIS — S12100D Unspecified displaced fracture of second cervical vertebra, subsequent encounter for fracture with routine healing: Secondary | ICD-10-CM | POA: Diagnosis not present

## 2021-09-26 DIAGNOSIS — S51012A Laceration without foreign body of left elbow, initial encounter: Secondary | ICD-10-CM | POA: Diagnosis not present

## 2021-09-26 DIAGNOSIS — M170 Bilateral primary osteoarthritis of knee: Secondary | ICD-10-CM | POA: Diagnosis not present

## 2021-09-26 DIAGNOSIS — S0990XA Unspecified injury of head, initial encounter: Secondary | ICD-10-CM | POA: Diagnosis not present

## 2021-09-26 DIAGNOSIS — T148XXA Other injury of unspecified body region, initial encounter: Secondary | ICD-10-CM | POA: Diagnosis not present

## 2021-09-26 DIAGNOSIS — S0181XD Laceration without foreign body of other part of head, subsequent encounter: Secondary | ICD-10-CM | POA: Diagnosis not present

## 2021-09-26 DIAGNOSIS — R6889 Other general symptoms and signs: Secondary | ICD-10-CM | POA: Diagnosis not present

## 2021-09-26 DIAGNOSIS — N189 Chronic kidney disease, unspecified: Secondary | ICD-10-CM | POA: Diagnosis not present

## 2021-09-26 DIAGNOSIS — R296 Repeated falls: Secondary | ICD-10-CM | POA: Diagnosis not present

## 2021-09-30 DIAGNOSIS — R0902 Hypoxemia: Secondary | ICD-10-CM | POA: Diagnosis not present

## 2021-09-30 DIAGNOSIS — S0101XA Laceration without foreign body of scalp, initial encounter: Secondary | ICD-10-CM | POA: Diagnosis not present

## 2021-09-30 DIAGNOSIS — S0083XD Contusion of other part of head, subsequent encounter: Secondary | ICD-10-CM | POA: Diagnosis not present

## 2021-09-30 DIAGNOSIS — S12101A Unspecified nondisplaced fracture of second cervical vertebra, initial encounter for closed fracture: Secondary | ICD-10-CM | POA: Diagnosis not present

## 2021-09-30 DIAGNOSIS — S30810A Abrasion of lower back and pelvis, initial encounter: Secondary | ICD-10-CM | POA: Diagnosis not present

## 2021-09-30 DIAGNOSIS — R000 Tachycardia, unspecified: Secondary | ICD-10-CM | POA: Diagnosis not present

## 2021-09-30 DIAGNOSIS — D1802 Hemangioma of intracranial structures: Secondary | ICD-10-CM | POA: Diagnosis not present

## 2021-09-30 DIAGNOSIS — R404 Transient alteration of awareness: Secondary | ICD-10-CM | POA: Diagnosis not present

## 2021-09-30 DIAGNOSIS — S129XXA Fracture of neck, unspecified, initial encounter: Secondary | ICD-10-CM | POA: Diagnosis not present

## 2021-09-30 DIAGNOSIS — W19XXXA Unspecified fall, initial encounter: Secondary | ICD-10-CM | POA: Diagnosis not present

## 2021-09-30 DIAGNOSIS — S12101D Unspecified nondisplaced fracture of second cervical vertebra, subsequent encounter for fracture with routine healing: Secondary | ICD-10-CM | POA: Diagnosis not present

## 2021-09-30 DIAGNOSIS — Z743 Need for continuous supervision: Secondary | ICD-10-CM | POA: Diagnosis not present

## 2021-09-30 DIAGNOSIS — I700 Atherosclerosis of aorta: Secondary | ICD-10-CM | POA: Diagnosis not present

## 2021-09-30 DIAGNOSIS — M47812 Spondylosis without myelopathy or radiculopathy, cervical region: Secondary | ICD-10-CM | POA: Diagnosis not present

## 2021-09-30 DIAGNOSIS — Z043 Encounter for examination and observation following other accident: Secondary | ICD-10-CM | POA: Diagnosis not present

## 2021-09-30 DIAGNOSIS — S0990XA Unspecified injury of head, initial encounter: Secondary | ICD-10-CM | POA: Diagnosis not present

## 2021-09-30 DIAGNOSIS — Z96652 Presence of left artificial knee joint: Secondary | ICD-10-CM | POA: Diagnosis not present

## 2021-09-30 DIAGNOSIS — I499 Cardiac arrhythmia, unspecified: Secondary | ICD-10-CM | POA: Diagnosis not present

## 2021-09-30 DIAGNOSIS — J984 Other disorders of lung: Secondary | ICD-10-CM | POA: Diagnosis not present

## 2021-09-30 DIAGNOSIS — I517 Cardiomegaly: Secondary | ICD-10-CM | POA: Diagnosis not present

## 2021-10-01 DIAGNOSIS — I4891 Unspecified atrial fibrillation: Secondary | ICD-10-CM | POA: Diagnosis not present

## 2021-10-01 DIAGNOSIS — I129 Hypertensive chronic kidney disease with stage 1 through stage 4 chronic kidney disease, or unspecified chronic kidney disease: Secondary | ICD-10-CM | POA: Diagnosis not present

## 2021-10-01 DIAGNOSIS — N189 Chronic kidney disease, unspecified: Secondary | ICD-10-CM | POA: Diagnosis not present

## 2021-10-01 DIAGNOSIS — M170 Bilateral primary osteoarthritis of knee: Secondary | ICD-10-CM | POA: Diagnosis not present

## 2021-10-02 DIAGNOSIS — N189 Chronic kidney disease, unspecified: Secondary | ICD-10-CM | POA: Diagnosis not present

## 2021-10-02 DIAGNOSIS — M170 Bilateral primary osteoarthritis of knee: Secondary | ICD-10-CM | POA: Diagnosis not present

## 2021-10-02 DIAGNOSIS — I4891 Unspecified atrial fibrillation: Secondary | ICD-10-CM | POA: Diagnosis not present

## 2021-10-02 DIAGNOSIS — I129 Hypertensive chronic kidney disease with stage 1 through stage 4 chronic kidney disease, or unspecified chronic kidney disease: Secondary | ICD-10-CM | POA: Diagnosis not present

## 2021-10-03 DIAGNOSIS — M170 Bilateral primary osteoarthritis of knee: Secondary | ICD-10-CM | POA: Diagnosis not present

## 2021-10-03 DIAGNOSIS — S12100D Unspecified displaced fracture of second cervical vertebra, subsequent encounter for fracture with routine healing: Secondary | ICD-10-CM | POA: Diagnosis not present

## 2021-10-03 DIAGNOSIS — R296 Repeated falls: Secondary | ICD-10-CM | POA: Diagnosis not present

## 2021-10-03 DIAGNOSIS — I129 Hypertensive chronic kidney disease with stage 1 through stage 4 chronic kidney disease, or unspecified chronic kidney disease: Secondary | ICD-10-CM | POA: Diagnosis not present

## 2021-10-03 DIAGNOSIS — N189 Chronic kidney disease, unspecified: Secondary | ICD-10-CM | POA: Diagnosis not present

## 2021-10-03 DIAGNOSIS — I4891 Unspecified atrial fibrillation: Secondary | ICD-10-CM | POA: Diagnosis not present

## 2021-10-07 DIAGNOSIS — N189 Chronic kidney disease, unspecified: Secondary | ICD-10-CM | POA: Diagnosis not present

## 2021-10-07 DIAGNOSIS — M170 Bilateral primary osteoarthritis of knee: Secondary | ICD-10-CM | POA: Diagnosis not present

## 2021-10-07 DIAGNOSIS — I4891 Unspecified atrial fibrillation: Secondary | ICD-10-CM | POA: Diagnosis not present

## 2021-10-07 DIAGNOSIS — R404 Transient alteration of awareness: Secondary | ICD-10-CM | POA: Diagnosis not present

## 2021-10-07 DIAGNOSIS — S81809A Unspecified open wound, unspecified lower leg, initial encounter: Secondary | ICD-10-CM | POA: Diagnosis not present

## 2021-10-07 DIAGNOSIS — Z7401 Bed confinement status: Secondary | ICD-10-CM | POA: Diagnosis not present

## 2021-10-07 DIAGNOSIS — Z4800 Encounter for change or removal of nonsurgical wound dressing: Secondary | ICD-10-CM | POA: Diagnosis not present

## 2021-10-07 DIAGNOSIS — Z743 Need for continuous supervision: Secondary | ICD-10-CM | POA: Diagnosis not present

## 2021-10-07 DIAGNOSIS — I129 Hypertensive chronic kidney disease with stage 1 through stage 4 chronic kidney disease, or unspecified chronic kidney disease: Secondary | ICD-10-CM | POA: Diagnosis not present

## 2021-10-09 DIAGNOSIS — M170 Bilateral primary osteoarthritis of knee: Secondary | ICD-10-CM | POA: Diagnosis not present

## 2021-10-09 DIAGNOSIS — I4891 Unspecified atrial fibrillation: Secondary | ICD-10-CM | POA: Diagnosis not present

## 2021-10-09 DIAGNOSIS — N189 Chronic kidney disease, unspecified: Secondary | ICD-10-CM | POA: Diagnosis not present

## 2021-10-09 DIAGNOSIS — I129 Hypertensive chronic kidney disease with stage 1 through stage 4 chronic kidney disease, or unspecified chronic kidney disease: Secondary | ICD-10-CM | POA: Diagnosis not present

## 2021-10-10 DIAGNOSIS — M170 Bilateral primary osteoarthritis of knee: Secondary | ICD-10-CM | POA: Diagnosis not present

## 2021-10-10 DIAGNOSIS — R296 Repeated falls: Secondary | ICD-10-CM | POA: Diagnosis not present

## 2021-10-10 DIAGNOSIS — I129 Hypertensive chronic kidney disease with stage 1 through stage 4 chronic kidney disease, or unspecified chronic kidney disease: Secondary | ICD-10-CM | POA: Diagnosis not present

## 2021-10-10 DIAGNOSIS — S0093XD Contusion of unspecified part of head, subsequent encounter: Secondary | ICD-10-CM | POA: Diagnosis not present

## 2021-10-10 DIAGNOSIS — S12100D Unspecified displaced fracture of second cervical vertebra, subsequent encounter for fracture with routine healing: Secondary | ICD-10-CM | POA: Diagnosis not present

## 2021-10-10 DIAGNOSIS — I4891 Unspecified atrial fibrillation: Secondary | ICD-10-CM | POA: Diagnosis not present

## 2021-10-10 DIAGNOSIS — N189 Chronic kidney disease, unspecified: Secondary | ICD-10-CM | POA: Diagnosis not present

## 2021-10-14 DIAGNOSIS — I4891 Unspecified atrial fibrillation: Secondary | ICD-10-CM | POA: Diagnosis not present

## 2021-10-14 DIAGNOSIS — N189 Chronic kidney disease, unspecified: Secondary | ICD-10-CM | POA: Diagnosis not present

## 2021-10-14 DIAGNOSIS — M170 Bilateral primary osteoarthritis of knee: Secondary | ICD-10-CM | POA: Diagnosis not present

## 2021-10-14 DIAGNOSIS — I129 Hypertensive chronic kidney disease with stage 1 through stage 4 chronic kidney disease, or unspecified chronic kidney disease: Secondary | ICD-10-CM | POA: Diagnosis not present

## 2021-10-16 DIAGNOSIS — R000 Tachycardia, unspecified: Secondary | ICD-10-CM | POA: Diagnosis not present

## 2021-10-16 DIAGNOSIS — Z79899 Other long term (current) drug therapy: Secondary | ICD-10-CM | POA: Diagnosis not present

## 2021-10-16 DIAGNOSIS — G9341 Metabolic encephalopathy: Secondary | ICD-10-CM | POA: Diagnosis not present

## 2021-10-16 DIAGNOSIS — I5033 Acute on chronic diastolic (congestive) heart failure: Secondary | ICD-10-CM | POA: Diagnosis not present

## 2021-10-16 DIAGNOSIS — I110 Hypertensive heart disease with heart failure: Secondary | ICD-10-CM | POA: Diagnosis not present

## 2021-10-16 DIAGNOSIS — K219 Gastro-esophageal reflux disease without esophagitis: Secondary | ICD-10-CM | POA: Diagnosis not present

## 2021-10-16 DIAGNOSIS — Z531 Procedure and treatment not carried out because of patient's decision for reasons of belief and group pressure: Secondary | ICD-10-CM | POA: Diagnosis not present

## 2021-10-16 DIAGNOSIS — Z880 Allergy status to penicillin: Secondary | ICD-10-CM | POA: Diagnosis not present

## 2021-10-16 DIAGNOSIS — F03918 Unspecified dementia, unspecified severity, with other behavioral disturbance: Secondary | ICD-10-CM | POA: Diagnosis not present

## 2021-10-16 DIAGNOSIS — Z7982 Long term (current) use of aspirin: Secondary | ICD-10-CM | POA: Diagnosis not present

## 2021-10-16 DIAGNOSIS — R509 Fever, unspecified: Secondary | ICD-10-CM | POA: Diagnosis not present

## 2021-10-16 DIAGNOSIS — I517 Cardiomegaly: Secondary | ICD-10-CM | POA: Diagnosis not present

## 2021-10-16 DIAGNOSIS — R531 Weakness: Secondary | ICD-10-CM | POA: Diagnosis not present

## 2021-10-16 DIAGNOSIS — J9811 Atelectasis: Secondary | ICD-10-CM | POA: Diagnosis not present

## 2021-10-16 DIAGNOSIS — I4819 Other persistent atrial fibrillation: Secondary | ICD-10-CM | POA: Diagnosis not present

## 2021-10-16 DIAGNOSIS — J811 Chronic pulmonary edema: Secondary | ICD-10-CM | POA: Diagnosis not present

## 2021-10-16 DIAGNOSIS — D649 Anemia, unspecified: Secondary | ICD-10-CM | POA: Diagnosis not present

## 2021-10-16 DIAGNOSIS — Z888 Allergy status to other drugs, medicaments and biological substances status: Secondary | ICD-10-CM | POA: Diagnosis not present

## 2021-10-16 DIAGNOSIS — Z515 Encounter for palliative care: Secondary | ICD-10-CM | POA: Diagnosis not present

## 2021-10-16 DIAGNOSIS — Z743 Need for continuous supervision: Secondary | ICD-10-CM | POA: Diagnosis not present

## 2021-10-16 DIAGNOSIS — R0602 Shortness of breath: Secondary | ICD-10-CM | POA: Diagnosis not present

## 2021-10-16 DIAGNOSIS — Z66 Do not resuscitate: Secondary | ICD-10-CM | POA: Diagnosis not present

## 2021-10-16 DIAGNOSIS — Z885 Allergy status to narcotic agent status: Secondary | ICD-10-CM | POA: Diagnosis not present

## 2021-10-16 DIAGNOSIS — I4891 Unspecified atrial fibrillation: Secondary | ICD-10-CM | POA: Diagnosis not present

## 2021-10-16 DIAGNOSIS — E876 Hypokalemia: Secondary | ICD-10-CM | POA: Diagnosis not present

## 2021-10-16 DIAGNOSIS — Z7401 Bed confinement status: Secondary | ICD-10-CM | POA: Diagnosis not present

## 2021-10-16 DIAGNOSIS — R5381 Other malaise: Secondary | ICD-10-CM | POA: Diagnosis not present

## 2021-10-16 DIAGNOSIS — R410 Disorientation, unspecified: Secondary | ICD-10-CM | POA: Diagnosis not present

## 2021-11-14 DEATH — deceased
